# Patient Record
Sex: Female | Born: 1955
Health system: Southern US, Community
[De-identification: ages and names within clinical notes are randomized; demographics above are authoritative.]

## PROBLEM LIST (undated history)

## (undated) DIAGNOSIS — I10 Essential (primary) hypertension: Secondary | ICD-10-CM

## (undated) HISTORY — DX: Essential (primary) hypertension: I10

---

## 1998-12-23 ENCOUNTER — Encounter: Payer: Self-pay | Admitting: Emergency Medicine

## 1998-12-23 ENCOUNTER — Emergency Department (HOSPITAL_COMMUNITY): Admission: EM | Admit: 1998-12-23 | Discharge: 1998-12-23 | Payer: Self-pay | Admitting: Emergency Medicine

## 2001-01-18 ENCOUNTER — Encounter: Admission: RE | Admit: 2001-01-18 | Discharge: 2001-01-18 | Payer: Self-pay | Admitting: Cardiology

## 2001-01-18 ENCOUNTER — Encounter: Payer: Self-pay | Admitting: Cardiology

## 2002-05-03 ENCOUNTER — Other Ambulatory Visit: Admission: RE | Admit: 2002-05-03 | Discharge: 2002-05-03 | Payer: Self-pay | Admitting: Obstetrics & Gynecology

## 2002-05-07 ENCOUNTER — Encounter: Payer: Self-pay | Admitting: Obstetrics & Gynecology

## 2002-05-07 ENCOUNTER — Encounter: Admission: RE | Admit: 2002-05-07 | Discharge: 2002-05-07 | Payer: Self-pay | Admitting: Obstetrics & Gynecology

## 2002-07-19 ENCOUNTER — Encounter (INDEPENDENT_AMBULATORY_CARE_PROVIDER_SITE_OTHER): Payer: Self-pay | Admitting: Specialist

## 2002-07-19 ENCOUNTER — Ambulatory Visit (HOSPITAL_COMMUNITY): Admission: RE | Admit: 2002-07-19 | Discharge: 2002-07-19 | Payer: Self-pay | Admitting: Obstetrics & Gynecology

## 2003-06-27 ENCOUNTER — Encounter: Admission: RE | Admit: 2003-06-27 | Discharge: 2003-06-27 | Payer: Self-pay | Admitting: Obstetrics & Gynecology

## 2003-06-27 ENCOUNTER — Encounter: Payer: Self-pay | Admitting: Obstetrics & Gynecology

## 2006-01-09 ENCOUNTER — Encounter: Admission: RE | Admit: 2006-01-09 | Discharge: 2006-01-09 | Payer: Self-pay | Admitting: *Deleted

## 2006-01-26 ENCOUNTER — Encounter: Admission: RE | Admit: 2006-01-26 | Discharge: 2006-01-26 | Payer: Self-pay | Admitting: *Deleted

## 2007-05-23 ENCOUNTER — Ambulatory Visit (HOSPITAL_COMMUNITY): Admission: RE | Admit: 2007-05-23 | Discharge: 2007-05-23 | Payer: Self-pay | Admitting: Cardiology

## 2010-11-07 ENCOUNTER — Encounter: Payer: Self-pay | Admitting: *Deleted

## 2011-03-04 NOTE — Op Note (Signed)
Meredith Randolph, Meredith Randolph                       ACCOUNT NO.:  [id number]   MEDICAL RECORD NO.:  [id number]                   PATIENT TYPE:  AMB   LOCATION:  SDC                                  FACILITY:  [facility]   PHYSICIAN:  Genia Del, M.D.             DATE OF BIRTH:  [birth year]   DATE OF PROCEDURE:  07/19/2002  DATE OF DISCHARGE:                                 OPERATIVE REPORT   PREOPERATIVE DIAGNOSES:  1. Intrauterine lesion, possible myoma versus polyp.  2. Menorrhagia.  3. Known uterine myomas.   POSTOPERATIVE DIAGNOSES:  1. Intrauterine lesion, possible myoma versus polyp.  2. Menorrhagia.  3. Known uterine myomas.  4. Probable submucosal myoma about 3 cm in diameter.   SURGEON:  Genia Del, M.D.   ANESTHESIOLOGIST:  Burnett Corrente, M.D.   INTERVENTIONS:  1. Hysteroscopy with dilatation and curettage.  2. Resection of submucosal myoma.   PROCEDURE:  Under general anesthesia with endotracheal intubation, the  patient is in lithotomy position.  She is prepped with Hibiclens on the  suprapubic, vulvar, and vaginal areas.  The bladder is catheterized and the  patient is draped as usual.  The vaginal exam reveals an anteverted uterus  corresponding to about 9-10 weeks', irregular, mobile.  No adnexal mass.  The speculum is introduced.  The anterior lip of the cervix is grasped with  a tenaculum.  Dilatation of the cervix is done with Hegar dilators up to 35  without difficulty.  We then introduced the operative hysteroscope with the  double loop.  Inspection of the intrauterine cavity reveals a voluminous  submucosal myoma coming from the anterior wall of the uterus measuring about  2 cm in diameter.  Small polyps are also probably present and the cavity is  irregular.  The ostia are nonetheless visualized and pictures are taken.  We  proceed with the double loop to resection of the submucosal myoma.  This is  done with many resections followed by removal of  each piece.  They are all  sent to pathology.  We then proceed with the curettage of the intrauterine  cavity.  This specimen is also sent to pathology.  On three occasions a  curettage was done and a grasper was used to remove pieces of the myoma from  the intrauterine cavity.  We then use coagulation to complete the  hemostasis.  The intrauterine cavity is visualized and appears more regular  at the edge of the intervention.  The anterior submucosal myoma is resected  to probably about 80%.  It was considered safer to stop the intervention at  this point because visualization is  suboptimal.  On curettage, the cavity feels regular.  Five pictures are  taken at the end of the intervention as well.  Estimated blood loss was 75  cc.  Fluid __________ was 200 cc.  No complication occurred and the patient  was transferred in good status to  recovery room.                                                 Genia Del, M.D.    ML/MEDQ  D:  07/19/2002  T:  07/19/2002  Job:  [id number]

## 2012-11-19 ENCOUNTER — Ambulatory Visit (INDEPENDENT_AMBULATORY_CARE_PROVIDER_SITE_OTHER): Payer: 59 | Admitting: Emergency Medicine

## 2012-11-19 VITALS — BP 130/80 | HR 84 | Temp 99.1°F | Resp 16 | Ht 66.5 in | Wt 211.0 lb

## 2012-11-19 DIAGNOSIS — J209 Acute bronchitis, unspecified: Secondary | ICD-10-CM

## 2012-11-19 MED ORDER — AZITHROMYCIN 250 MG PO TABS: ORAL_TABLET | ORAL | Status: DC

## 2012-11-19 MED ORDER — ALBUTEROL SULFATE (2.5 MG/3ML) 0.083% IN NEBU
5.0000 mg | INHALATION_SOLUTION | Freq: Once | RESPIRATORY_TRACT | Status: AC
  Administered 2012-11-19: 5 mg via RESPIRATORY_TRACT

## 2012-11-19 MED ORDER — HYDROCOD POLST-CHLORPHEN POLST 10-8 MG/5ML PO LQCR: 5.0000 mL | Freq: Two times a day (BID) | ORAL | Status: DC | PRN

## 2012-11-19 MED ORDER — ALBUTEROL SULFATE HFA 108 (90 BASE) MCG/ACT IN AERS: 2.0000 | INHALATION_SPRAY | RESPIRATORY_TRACT | Status: DC | PRN

## 2012-11-19 MED ORDER — IPRATROPIUM BROMIDE 0.02 % IN SOLN
0.5000 mg | Freq: Once | RESPIRATORY_TRACT | Status: AC
  Administered 2012-11-19: 0.5 mg via RESPIRATORY_TRACT

## 2012-11-19 NOTE — Progress Notes (Signed)
Reviewed and agree.

## 2012-11-19 NOTE — Patient Instructions (Addendum)
Metered Dose Inhaler (No Spacer Used) Inhaled medicines are the basis of asthma treatment and other breathing problems. Inhaled medicine can only be effective if used properly. Good technique assures that the medicine reaches the lungs. Metered dose inhalers (MDIs) are used to deliver a variety of inhaled medicines. These include quick relief medicines, controller medicines (such as corticosteroids), and cromolyn. The medicine is delivered by pushing down on a metal canister to release a set amount of spray.  If you are using different kinds of inhalers, use your quick relief medicine to open the airways 10 to 15 minutes before using a steroid. If you are unsure which inhalers to use and the order of using them, ask your caregiver, nurse, or respiratory therapist. HOW TO USE THE INHALER 1. Remove cap from inhaler. 2. Shake inhaler for 5 seconds before each inhalation (breathing in). 3. Position the inhaler so that the top of the canister faces up. 4. Put your index finger on the top of the medication canister. Your thumb supports the bottom of the inhaler. 5. Open your mouth. 6. Hold the inhaler 1 to 2 inches away from your open mouth. This allows the medicine to slow down before the medicine enters the mouth. 7. Exhale (breathe out) normally and as completely as possible. 8. Press the canister down with the index finger to release the medication. 9. At the same time as the canister is pressed, inhale deeply and slowly until the lungs are completely filled. This should take 4 to 6 seconds. Keep your tongue down. 10. Hold the medication in your lungs for up to 10 seconds (10 seconds is best). This helps the medicine get into the small airways of your lungs to work better. 11. Breathe out slowly, through pursed lips. Whistling is an example of pursed lips. 12. Wait at least 1 minute between puffs. Continue with the above steps until you have taken the number of puffs your caregiver has  ordered. 13. Replace cap on inhaler. AVOID:  Inhaling before or after starting the spray of medicine. It takes practice to coordinate your breathing with triggering the spray.  Inhaling through the nose (rather than the mouth) when triggering the spray. HOW TO DETERMINE IF YOUR INHALER IS FULL OR NEARLY EMPTY:  Determine when an inhaler is empty. You cannot know when an MDI canister is empty by shaking it. A few MDIs are now being made with dose counters. Ask your caregiver for a prescription that has a dose counter if you feel you need that extra help.  If your inhaler does not have a counter, check the number of doses in the inhaler before you use it. The canister or box will list the number of doses in the canister. Divide the total number of doses in the canister by the number you will use each day to find how many days the canister will last. (For example, if your canister has 200 doses and you take 2 puffs, 4 times each day, which is 8 puffs a day. Dividing 200 by 8 equals 25. The canister should last 25 days.) Using a calendar, count forward that many days to see when your inhaler will run out. Write the refill date on a calendar or your canister.  Remember, if you need to take extra doses, the inhaler will empty sooner than you figured. Be sure you have a refill before your canister runs out. Refill your inhaler 7 to 10 days before it runs out. HOME CARE INSTRUCTIONS   Do   not use the inhaler more than your caregiver tells you. If you are still wheezing and are feeling tightness in your chest, call your caregiver.  Keep an adequate supply of medication. This includes making sure the medicine is not expired, and you have a spare MDI.  Follow your caregiver or inhaler insert directions for cleaning the inhaler. SEEK MEDICAL CARE IF:   Symptoms are only partially relieved with your inhalers.  You are having trouble using your inhalers.  You experience some increase in phlegm.  You  develop a fever of 102 F (38.9 C). SEEK IMMEDIATE MEDICAL CARE IF:   You feel little or no relief with your inhalers. You are still wheezing and are feeling shortness of breath and/or tightness in your chest.  You have side effects such as dizziness, headaches, or fast heart rate.  You have chills, fever, night sweats or an oral temperature above 102 F (38.9 C) develops.  Phlegm production increases a lot, or there is blood in the phlegm. MAKE SURE YOU:   Understand these instructions.  Will watch your condition.  Will get help right away if you are not doing well or get worse. Document Released: 07/31/2007 Document Revised: 12/26/2011 Document Reviewed: 07/21/2009 [facility] Patient Information 2013 [place], [place]. Bronchitis Bronchitis is the body's way of reacting to injury and/or infection (inflammation) of the bronchi. Bronchi are the air tubes that extend from the windpipe into the lungs. If the inflammation becomes severe, it may cause shortness of breath. CAUSES  Inflammation may be caused by:  A virus.  Germs (bacteria).  Dust.  Allergens.  Pollutants and many other irritants. The cells lining the bronchial tree are covered with tiny hairs (cilia). These constantly beat upward, away from the lungs, toward the mouth. This keeps the lungs free of pollutants. When these cells become too irritated and are unable to do their job, mucus begins to develop. This causes the characteristic cough of bronchitis. The cough clears the lungs when the cilia are unable to do their job. Without either of these protective mechanisms, the mucus would settle in the lungs. Then you would develop pneumonia. Smoking is a common cause of bronchitis and can contribute to pneumonia. Stopping this habit is the single most important thing you can do to help yourself. TREATMENT   Your caregiver may prescribe an antibiotic if the cough is caused by bacteria. Also, medicines that open up your  airways make it easier to breathe. Your caregiver may also recommend or prescribe an expectorant. It will loosen the mucus to be coughed up. Only take over-the-counter or prescription medicines for pain, discomfort, or fever as directed by your caregiver.  Removing whatever causes the problem (smoking, for example) is critical to preventing the problem from getting worse.  Cough suppressants may be prescribed for relief of cough symptoms.  Inhaled medicines may be prescribed to help with symptoms now and to help prevent problems from returning.  For those with recurrent (chronic) bronchitis, there may be a need for steroid medicines. SEEK IMMEDIATE MEDICAL CARE IF:   During treatment, you develop more pus-like mucus (purulent sputum).  You have a fever.  Your baby is older than 3 months with a rectal temperature of 102 F (38.9 C) or higher.  Your baby is 48 months old or younger with a rectal temperature of 100.4 F (38 C) or higher.  You become progressively more ill.  You have increased difficulty breathing, wheezing, or shortness of breath. It is necessary to seek immediate  medical care if you are elderly or sick from any other disease. MAKE SURE YOU:   Understand these instructions.  Will watch your condition.  Will get help right away if you are not doing well or get worse. Document Released: 10/03/2005 Document Revised: 12/26/2011 Document Reviewed: 08/12/2008 [facility] Patient Information 2013 [place], [place].

## 2012-11-19 NOTE — Progress Notes (Signed)
[facility] [address] [place] [address] [phone]  Date:  11/19/2012   Name:  Meredith Randolph   DOB:  May 23, 1956   MRN:  [id number]  PCP:  No primary provider on file.    Chief Complaint: Cough   History of Present Illness:  Meredith Randolph is a 57 y.o. very pleasant female patient who presents with the following:  Ill for a week with a cough.  Had an upper respiratory infection that she treated with OTC medications.  Now has nasal congestion, post nasal drainage and a mucoid nasal discharge complicated by a non productive rather persistent cough.  No wheezing or shortness of breath.  No nausea or vomiting. Has a few days of fever a couple days ago that has now resolved.  No history of asthma or bronchospasm.  No inhaler use.  There is no problem list on file for this patient.   No past medical history on file.  No past surgical history on file.  History  Substance Use Topics  . Smoking status: Never Smoker   . Smokeless tobacco: Not on file  . Alcohol Use: Not on file    No family history on file.  No Known Allergies  Medication list has been reviewed and updated.  No current outpatient prescriptions on file prior to visit.    Review of Systems:  As per HPI, otherwise negative.    Physical Examination: Filed Vitals:   11/19/12 0959  BP: 130/80  Pulse: 84  Temp: 99.1 F (37.3 C)  Resp: 16   Filed Vitals:   11/19/12 0959  Height: 5' 6.5" (1.689 m)  Weight: 211 lb (95.709 kg)   Body mass index is 33.55 kg/(m^2). Ideal Body Weight: Weight in (lb) to have BMI = 25: 156.9   GEN: WDWN, NAD, Non-toxic, A & O x 3 HEENT: Atraumatic, Normocephalic. Neck supple. No masses, No LAD. Ears and Nose: No external deformity. CV: RRR, No M/G/R. No JVD. No thrill. No extra heart sounds. PULM: CTA B, diffuse wheezes, no crackles, rhonchi. No retractions. No resp. distress. No accessory muscle use. ABD: S, NT, ND, +BS. No rebound. No  HSM. EXTR: No c/c/e NEURO Normal gait.  PSYCH: Normally interactive. Conversant. Not depressed or anxious appearing.  Calm demeanor.    Assessment and Plan: Bronchitis with bronchospasm Albuterol neb zpak Albuterol MDI tussionex  Carmelina Dane, MD

## 2012-11-22 ENCOUNTER — Telehealth: Payer: Self-pay

## 2012-11-22 ENCOUNTER — Ambulatory Visit (INDEPENDENT_AMBULATORY_CARE_PROVIDER_SITE_OTHER): Payer: 59 | Admitting: Family Medicine

## 2012-11-22 VITALS — BP 170/94 | HR 73 | Temp 97.8°F | Resp 18 | Ht 66.5 in | Wt 211.0 lb

## 2012-11-22 DIAGNOSIS — R42 Dizziness and giddiness: Secondary | ICD-10-CM

## 2012-11-22 DIAGNOSIS — R5383 Other fatigue: Secondary | ICD-10-CM

## 2012-11-22 DIAGNOSIS — I10 Essential (primary) hypertension: Secondary | ICD-10-CM

## 2012-11-22 DIAGNOSIS — R5381 Other malaise: Secondary | ICD-10-CM

## 2012-11-22 DIAGNOSIS — J209 Acute bronchitis, unspecified: Secondary | ICD-10-CM

## 2012-11-22 LAB — POCT CBC
Granulocyte percent: 57.9 %G (ref 37–80)
HCT, POC: 44.4 % (ref 37.7–47.9)
Hemoglobin: 14.1 g/dL (ref 12.2–16.2)
MCV: 91.8 fL (ref 80–97)
POC Granulocyte: 3.8 (ref 2–6.9)
POC LYMPH PERCENT: 34.3 %L (ref 10–50)
RBC: 4.84 M/uL (ref 4.04–5.48)

## 2012-11-22 LAB — COMPREHENSIVE METABOLIC PANEL
ALT: 31 U/L (ref 0–35)
Albumin: 4.3 g/dL (ref 3.5–5.2)
CO2: 27 mEq/L (ref 19–32)
Chloride: 102 mEq/L (ref 96–112)
Glucose, Bld: 90 mg/dL (ref 70–99)
Potassium: 4.4 mEq/L (ref 3.5–5.3)
Sodium: 139 mEq/L (ref 135–145)
Total Bilirubin: 0.4 mg/dL (ref 0.3–1.2)
Total Protein: 7.5 g/dL (ref 6.0–8.3)

## 2012-11-22 LAB — POCT URINALYSIS DIPSTICK
Ketones, UA: 15
Leukocytes, UA: NEGATIVE
Protein, UA: 30
Urobilinogen, UA: 0.2
pH, UA: 5

## 2012-11-22 LAB — GLUCOSE, POCT (MANUAL RESULT ENTRY): POC Glucose: 86 mg/dl (ref 70–99)

## 2012-11-22 NOTE — Telephone Encounter (Signed)
Assessment and Plan:  Bronchitis with bronchospasm  Albuterol neb  zpak  Albuterol MDI  tussionex From office visit on 11/20/11. Patient c/o feeling weak and dizzy, she states her chest congestion is improving but feels very weak. I have advised her to return to clinic. She will come in now.

## 2012-11-22 NOTE — Progress Notes (Signed)
[address]   [place], [place]  [address]   [phone]  Subjective:    Patient ID: Meredith Randolph, female    DOB: 1956-06-23, 57 y.o.   MRN: [id number]  HPIThis 57 y.o. female presents for evaluation of fatigue, dizziness.  Evaluated on 11/19/12 for cough; diagnosed with bronchitis; rx for Zpack, Albuterol, Tussionex. Taking medication as prescribed; taking Tussionex bid.   Chest congestion improving but now feeling weak, nauseated, mild dizziness.  No fever but +sweats; mild chills.  No headache.  No ear pain or sore throat.  No rhinorrhea; +nasal congestion; +PND.  +coughing; no SOB; +sputum production.  No ringing in ears.  Mild nausea this morning with dizziness.  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  No food intake at that point in the morning; has been eating and drinking normally.  Duration of dizziness for a few minutes this morning.  Felt like walking on boat; no spinning of the room.  No blurred vision or double vision.  Weakness has continued today.  Felt better yesterday and then awoke and felt poorly.  No focal weakness; only overall malaise and weakness.  No v/d.  Normal urine output.  History of HTN; stopped medication because improved with diet and exercise.  PCP: [facility] last visit several years ago.    Review of Systems  Constitutional: Positive for chills, diaphoresis and fatigue. Negative for fever.  HENT: Positive for congestion, postnasal drip and sinus pressure. Negative for sore throat, rhinorrhea, trouble swallowing and voice change.   Respiratory: Positive for cough. Negative for shortness of breath, wheezing and stridor.   Cardiovascular: Negative for chest pain, palpitations and leg swelling.  Gastrointestinal: Positive for nausea. Negative for vomiting, abdominal pain and diarrhea.  Genitourinary: Negative for dysuria, frequency, decreased urine volume and difficulty urinating.  Neurological: Positive for dizziness,  weakness and light-headedness. Negative for tremors, seizures, syncope, facial asymmetry, speech difficulty, numbness and headaches.  Hematological: Negative for adenopathy.  Psychiatric/Behavioral: Negative for confusion.    History reviewed. No pertinent past medical history.  History reviewed. No pertinent past surgical history.  Prior to Admission medications   Medication Sig Start Date End Date Taking? Authorizing Provider  albuterol (PROVENTIL HFA;VENTOLIN HFA) 108 (90 BASE) MCG/ACT inhaler Inhale 2 puffs into the lungs every 4 (four) hours as needed for wheezing (cough, shortness of breath or wheezing.). 11/19/12  Yes Phillips Odor, MD  azithromycin (ZITHROMAX) 250 MG tablet Take 2 tabs PO x 1 dose, then 1 tab PO QD x 4 days 11/19/12  Yes Phillips Odor, MD  chlorpheniramine-HYDROcodone [facility] PENNKINETIC ER) 10-8 MG/5ML LQCR Take 5 mLs by mouth every 12 (twelve) hours as needed (cough). 11/19/12  Yes Phillips Odor, MD    No Known Allergies  History   Social History  . Marital Status: Married    Spouse Name: N/A    Number of Children: N/A  . Years of Education: N/A   Occupational History  . Not on file.   Social History Main Topics  . Smoking status: Former Games developer  . Smokeless tobacco: Not on file  . Alcohol Use: No  . Drug Use: No  . Sexually Active: Not on file   Other Topics Concern  . Not on file   Social History Narrative   Employment: retired.    No family history on file.     Objective:   Physical Exam  Nursing note and vitals reviewed. Constitutional: She is oriented to  person, place, and time. She appears well-developed and well-nourished. No distress.  HENT:  Head: Normocephalic and atraumatic.  Right Ear: External ear normal.  Left Ear: External ear normal.  Nose: Nose normal.  Mouth/Throat: Oropharynx is clear and moist.  Eyes: Conjunctivae normal and EOM are normal. Pupils are equal, round, and reactive to light.  Neck: Normal range  of motion. Neck supple. No JVD present. No thyromegaly present.  Cardiovascular: Normal rate, regular rhythm and normal heart sounds.  Exam reveals no gallop and no friction rub.   No murmur heard. Pulmonary/Chest: Effort normal and breath sounds normal. She has no wheezes. She has no rales.  Abdominal: Soft. Bowel sounds are normal. She exhibits no distension and no mass. There is no tenderness. There is no rebound and no guarding.  Lymphadenopathy:    She has no cervical adenopathy.  Neurological: She is alert and oriented to person, place, and time. She has normal strength. No cranial nerve deficit. She displays a negative Romberg sign. Coordination normal.  Skin: She is not diaphoretic.  Psychiatric: She has a normal mood and affect. Her behavior is normal.    EKG:  NSR; no arrhythmias; no ST changes.  Results for orders placed in visit on 11/22/12  POCT CBC      Component Value Range   WBC 6.6  4.6 - 10.2 K/uL   Lymph, poc 2.3  0.6 - 3.4   POC LYMPH PERCENT 34.3  10 - 50 %L   MID (cbc) 0.5  0 - 0.9   POC MID % 7.8  0 - 12 %M   POC Granulocyte 3.8  2 - 6.9   Granulocyte percent 57.9  37 - 80 %G   RBC 4.84  4.04 - 5.48 M/uL   Hemoglobin 14.1  12.2 - 16.2 g/dL   HCT, POC 40.9  81.1 - 47.9 %   MCV 91.8  80 - 97 fL   MCH, POC 29.1  27 - 31.2 pg   MCHC 31.8  31.8 - 35.4 g/dL   RDW, POC 91.4     Platelet Count, POC 199  142 - 424 K/uL   MPV 11.9  0 - 99.8 fL  GLUCOSE, POCT (MANUAL RESULT ENTRY)      Component Value Range   POC Glucose 86  70 - 99 mg/dl  POCT URINALYSIS DIPSTICK      Component Value Range   Color, UA amber     Clarity, UA cloudy     Glucose, UA neg     Bilirubin, UA neg     Ketones, UA 15     Spec Grav, UA >=1.030     Blood, UA neg     pH, UA 5.0     Protein, UA 30     Urobilinogen, UA 0.2     Nitrite, UA neg     Leukocytes, UA Negative           Assessment & Plan:   1. HTN (hypertension)  POCT CBC, POCT glucose (manual entry), Comprehensive  metabolic panel, POCT urinalysis dipstick, EKG 12-Lead  2. Dizziness  POCT CBC, POCT glucose (manual entry), Comprehensive metabolic panel, POCT urinalysis dipstick  3. Fatigue  POCT CBC, POCT glucose (manual entry), Comprehensive metabolic panel, POCT urinalysis dipstick  4. Acute bronchitis       1.  Dizziness:  New.  Normal neurological exam.  Normal glucose, CBC.  Normal EKG.  Urine concentrated; dehydration and acute illness likely contributing; medication/Tussionex may also be contributing.  Advised to stop Tussionex and increase fluid intake. 2.  Fatigue/Malaise:  New.  Associated with dizziness: supportive care with rest, fluids.  May also be secondary to acute illness, medication. Advise to stop Tussionex. 3.  HTN:  Worsening; normal BP at visit 48 hours ago with elevation today.  Recommend checking blood pressure daily at home.  If BP remains>140/90, RTC for treatment.   4.  Acute Bronchitis: improving; stop Tussionex due to current symptoms; complete Zpack.

## 2012-11-22 NOTE — Patient Instructions (Addendum)
1. HTN (hypertension)  POCT CBC, POCT glucose (manual entry), Comprehensive metabolic panel, POCT urinalysis dipstick, EKG 12-Lead  2. Dizziness  POCT CBC, POCT glucose (manual entry), Comprehensive metabolic panel, POCT urinalysis dipstick  3. Fatigue  POCT CBC, POCT glucose (manual entry), Comprehensive metabolic panel, POCT urinalysis dipstick  4. Acute bronchitis       INCREASE FLUID INTAKE OVER NEXT WEEK. STOP TAKING COUGH SYRUP.

## 2012-11-22 NOTE — Telephone Encounter (Signed)
Patient was seen by Dr. Dareen Piano, she is weak, and dizzy would like a call back.   She asked when she could expect a call back, advised within 24 hours.  "What if the patient is dying?"  Advised to call [phone]  [phone]

## 2013-01-02 ENCOUNTER — Other Ambulatory Visit: Payer: Self-pay | Admitting: Emergency Medicine

## 2013-05-28 ENCOUNTER — Ambulatory Visit (INDEPENDENT_AMBULATORY_CARE_PROVIDER_SITE_OTHER): Payer: 59 | Admitting: Family Medicine

## 2013-05-28 VITALS — BP 138/80 | HR 65 | Temp 97.9°F | Resp 18 | Ht 65.0 in | Wt 210.0 lb

## 2013-05-28 DIAGNOSIS — G44209 Tension-type headache, unspecified, not intractable: Secondary | ICD-10-CM

## 2013-05-28 DIAGNOSIS — I10 Essential (primary) hypertension: Secondary | ICD-10-CM

## 2013-05-28 MED ORDER — NAPROXEN 500 MG PO TABS: 500.0000 mg | ORAL_TABLET | Freq: Two times a day (BID) | ORAL | Status: DC

## 2013-05-28 MED ORDER — LOSARTAN POTASSIUM 100 MG PO TABS: 100.0000 mg | ORAL_TABLET | Freq: Every day | ORAL | Status: DC

## 2013-05-28 NOTE — Progress Notes (Signed)
  Subjective:    Patient ID: Meredith Randolph, female    DOB: [birth year], 57 y.o.   MRN: [id number] Chief Complaint  Patient presents with  . Hypertension    elevated bp , headaches yesterday    HPI Used to be on BP meds but ran out about a yr ago and was doing well off of medicine with TLC and weight loss.  Checks BP at home and was often 150s systolic.  However, last night she knew her BP was up - she didn't check it - but she had severe HA and left neck pain and a sweat. Took a low dose of Exforge - her husband's BP medicine. All sxs resolved about after she took the exforge.  However, she still has a posterior HA today. She has not taken any medication - nsaids or tylenol for this.  History reviewed. No pertinent past medical history. No current outpatient prescriptions on file prior to visit.   No current facility-administered medications on file prior to visit.   No Known Allergies   Review of Systems  Constitutional: Positive for fatigue. Negative for fever, chills, diaphoresis and appetite change.  Eyes: Negative for visual disturbance.  Respiratory: Negative for cough and shortness of breath.   Cardiovascular: Negative for chest pain, palpitations and leg swelling.  Genitourinary: Negative for decreased urine volume.  Neurological: Positive for headaches. Negative for syncope.  Hematological: Does not bruise/bleed easily.      BP 138/80  Pulse 65  Temp(Src) 97.9 F (36.6 C) (Oral)  Resp 18  Ht 5\' 5"  (1.651 m)  Wt 210 lb (95.255 kg)  BMI 34.95 kg/m2  SpO2 96% Objective:   Physical Exam  Constitutional: She is oriented to person, place, and time. She appears well-developed and well-nourished. No distress.  HENT:  Head: Normocephalic and atraumatic.  Right Ear: External ear normal.  Left Ear: External ear normal.  Eyes: Conjunctivae are normal. No scleral icterus.  Neck: Normal range of motion. Neck supple. No thyromegaly present.  Cardiovascular: Normal  rate, regular rhythm, normal heart sounds and intact distal pulses.   Pulmonary/Chest: Effort normal and breath sounds normal. No respiratory distress.  Musculoskeletal: She exhibits no edema.  Lymphadenopathy:    She has no cervical adenopathy.  Neurological: She is alert and oriented to person, place, and time.  Skin: Skin is warm and dry. She is not diaphoretic. No erythema.  Psychiatric: She has a normal mood and affect. Her behavior is normal.          Assessment & Plan:  Tension headache - RTC if HA cont or worsens.  Essential hypertension, benign- recheck in 2-3 weeks here with labs.  Meds ordered this encounter  Medications  . losartan (COZAAR) 100 MG tablet    Sig: Take 1 tablet (100 mg total) by mouth daily.    Dispense:  90 tablet    Refill:  0  . naproxen (NAPROSYN) 500 MG tablet    Sig: Take 1 tablet (500 mg total) by mouth 2 (two) times daily with a meal. For a headache    Dispense:  30 tablet    Refill:  0

## 2013-05-30 DIAGNOSIS — I10 Essential (primary) hypertension: Secondary | ICD-10-CM | POA: Insufficient documentation

## 2014-02-11 ENCOUNTER — Other Ambulatory Visit: Payer: Self-pay | Admitting: Family Medicine

## 2014-03-15 ENCOUNTER — Other Ambulatory Visit: Payer: Self-pay | Admitting: Physician Assistant

## 2014-03-16 NOTE — Telephone Encounter (Signed)
Needs office visit.

## 2014-03-30 ENCOUNTER — Other Ambulatory Visit: Payer: Self-pay | Admitting: Physician Assistant

## 2014-03-30 DIAGNOSIS — Z13220 Encounter for screening for lipoid disorders: Secondary | ICD-10-CM

## 2014-03-30 DIAGNOSIS — R748 Abnormal levels of other serum enzymes: Secondary | ICD-10-CM

## 2014-03-31 ENCOUNTER — Ambulatory Visit (INDEPENDENT_AMBULATORY_CARE_PROVIDER_SITE_OTHER): Payer: 59 | Admitting: Family Medicine

## 2014-03-31 VITALS — BP 140/90 | HR 61 | Temp 97.7°F | Resp 18 | Ht 64.5 in | Wt 212.6 lb

## 2014-03-31 DIAGNOSIS — I10 Essential (primary) hypertension: Secondary | ICD-10-CM

## 2014-03-31 LAB — COMPLETE METABOLIC PANEL WITHOUT GFR
ALT: 71 U/L — ABNORMAL HIGH (ref 0–35)
AST: 52 U/L — ABNORMAL HIGH (ref 0–37)
Albumin: 4.1 g/dL (ref 3.5–5.2)
BUN: 15 mg/dL (ref 6–23)
Calcium: 9.2 mg/dL (ref 8.4–10.5)
Chloride: 105 meq/L (ref 96–112)
Potassium: 4.2 meq/L (ref 3.5–5.3)
Total Protein: 7.2 g/dL (ref 6.0–8.3)

## 2014-03-31 LAB — COMPLETE METABOLIC PANEL WITH GFR
Alkaline Phosphatase: 191 U/L — ABNORMAL HIGH (ref 39–117)
CO2: 27 mEq/L (ref 19–32)
Creat: 0.87 mg/dL (ref 0.50–1.10)
GFR, Est African American: 85 mL/min
GFR, Est Non African American: 74 mL/min
Glucose, Bld: 89 mg/dL (ref 70–99)
Sodium: 141 mEq/L (ref 135–145)
Total Bilirubin: 0.6 mg/dL (ref 0.2–1.2)

## 2014-03-31 LAB — MICROALBUMIN, URINE: Microalb, Ur: 1.85 mg/dL (ref 0.00–1.89)

## 2014-03-31 MED ORDER — LOSARTAN POTASSIUM 100 MG PO TABS
100.0000 mg | ORAL_TABLET | Freq: Every day | ORAL | Status: DC
Start: 2014-03-31 — End: 2016-07-26

## 2014-04-01 ENCOUNTER — Encounter: Payer: Self-pay | Admitting: Family Medicine

## 2014-04-01 NOTE — Progress Notes (Signed)
Chief Complaint:  Chief Complaint  Patient presents with  . Medication Refill    losartan    HPI: Meredith Randolph is a 58 y.o. female who is here for  HTN medication refills. She does not take her meds all the time She does not take her BP all the time She denies any CP, dizziness, HAs , palpitations currently She has tried HCTZ in the past and that did not go well. She is not sure if she has had any   Past Medical History  Diagnosis Date  . Hypertension    History reviewed. No pertinent past surgical history. History   Social History  . Marital Status: Married    Spouse Name: N/A    Number of Children: N/A  . Years of Education: N/A   Social History Main Topics  . Smoking status: Former Games developermoker  . Smokeless tobacco: None  . Alcohol Use: No  . Drug Use: No  . Sexual Activity: None   Other Topics Concern  . None   Social History Narrative   Employment: retired.   History reviewed. No pertinent family history. No Known Allergies Prior to Admission medications   Medication Sig Start Date End Date Taking? Authorizing Provider  losartan (COZAAR) 100 MG tablet Take 1 tablet (100 mg total) by mouth daily. 03/31/14  Yes Brannon Decaire P Rojean Ige, DO     ROS: The patient denies fevers, chills, night sweats, unintentional weight loss, chest pain, palpitations, wheezing, dyspnea on exertion, nausea, vomiting, abdominal pain, dysuria, hematuria, melena, numbness, weakness, or tingling.   All other systems have been reviewed and were otherwise negative with the exception of those mentioned in the HPI and as above.    PHYSICAL EXAM: Filed Vitals:   03/31/14 0959  BP: 140/90  Pulse: 61  Temp: 97.7 F (36.5 C)  Resp: 18   Filed Vitals:   03/31/14 0959  Height: 5' 4.5" (1.638 m)  Weight: 212 lb 9.6 oz (96.435 kg)   Body mass index is 35.94 kg/(m^2).  General: Alert, no acute distress HEENT:  Normocephalic, atraumatic, oropharynx patent. EOMI, PERRLA Cardiovascular:   Regular rate and rhythm, no rubs murmurs or gallops.  No Carotid bruits, radial pulse intact. No pedal edema.  Respiratory: Clear to auscultation bilaterally.  No wheezes, rales, or rhonchi.  No cyanosis, no use of accessory musculature GI: No organomegaly, abdomen is soft and non-tender, positive bowel sounds.  No masses. Skin: No rashes. Neurologic: Facial musculature symmetric. Psychiatric: Patient is appropriate throughout our interaction. Lymphatic: No cervical lymphadenopathy Musculoskeletal: Gait intact.   LABS: Results for orders placed in visit on 03/31/14  COMPLETE METABOLIC PANEL WITH GFR      Result Value Ref Range   Sodium 141  135 - 145 mEq/L   Potassium 4.2  3.5 - 5.3 mEq/L   Chloride 105  96 - 112 mEq/L   CO2 27  19 - 32 mEq/L   Glucose, Bld 89  70 - 99 mg/dL   BUN 15  6 - 23 mg/dL   Creat 1.910.87  4.780.50 - 2.951.10 mg/dL   Total Bilirubin 0.6  0.2 - 1.2 mg/dL   Alkaline Phosphatase 191 (*) 39 - 117 U/L   AST 52 (*) 0 - 37 U/L   ALT 71 (*) 0 - 35 U/L   Total Protein 7.2  6.0 - 8.3 g/dL   Albumin 4.1  3.5 - 5.2 g/dL   Calcium 9.2  8.4 - 62.110.5 mg/dL   GFR,  Est African American 85     GFR, Est Non African American 74    MICROALBUMIN, URINE      Result Value Ref Range   Microalb, Ur 1.85  0.00 - 1.89 mg/dL     EKG/XRAY:   Primary read [facility] Archimedes [facility].   ASSESSMENT/PLAN: Encounter Diagnosis  Name Primary?  . HTN (hypertension) Yes   Refilled meds x 6 months Advise her to modify diet and exercise, medication compliancy, BP goal should be less than 150/90 if higher than need to consider other medications Labs pending F/u 6 months  Gross sideeffects, risk and benefits, and alternatives of medications d/w patient. Patient is aware that all medications have potential sideeffects and we are unable to predict every sideeffect or drug-drug interaction that may occur.  Janelle Spellman PHUONG, DO 04/01/2014 10:08 AM

## 2014-04-09 NOTE — Telephone Encounter (Signed)
Spoke to patient about labs, will need recheck labs only. She deneis GB, liver issues in past, alcohol use, excessive tylenol use, new meds, high cholesterol, new foods or travels. Has some mild intermittent very non-noticeable LUQ abd pain but nothing to complain about if I had not asked. No n/v or problems with food intake. She will come in for lipids and also cmp, if still abnormal then will add acute hepatitis panel and also get abd US.

## 2014-04-10 ENCOUNTER — Other Ambulatory Visit (INDEPENDENT_AMBULATORY_CARE_PROVIDER_SITE_OTHER): Payer: 59 | Admitting: Family Medicine

## 2014-04-10 ENCOUNTER — Other Ambulatory Visit: Payer: Self-pay | Admitting: Family Medicine

## 2014-04-10 DIAGNOSIS — R748 Abnormal levels of other serum enzymes: Secondary | ICD-10-CM

## 2014-04-10 DIAGNOSIS — Z13220 Encounter for screening for lipoid disorders: Secondary | ICD-10-CM

## 2014-04-10 LAB — COMPLETE METABOLIC PANEL WITHOUT GFR
BUN: 21 mg/dL (ref 6–23)
CO2: 26 meq/L (ref 19–32)
Creat: 0.89 mg/dL (ref 0.50–1.10)
GFR, Est African American: 83 mL/min
GFR, Est Non African American: 72 mL/min
Glucose, Bld: 92 mg/dL (ref 70–99)
Total Bilirubin: 0.6 mg/dL (ref 0.2–1.2)

## 2014-04-10 LAB — LIPID PANEL
Cholesterol: 232 mg/dL — ABNORMAL HIGH (ref 0–200)
HDL: 79 mg/dL (ref >39)
LDL Cholesterol: 142 mg/dL — ABNORMAL HIGH (ref 0–99)
Total CHOL/HDL Ratio: 2.9 Ratio
Triglycerides: 54 mg/dL (ref <150)
VLDL: 11 mg/dL (ref 0–40)

## 2014-04-10 LAB — COMPLETE METABOLIC PANEL WITH GFR
ALT: 54 U/L — ABNORMAL HIGH (ref 0–35)
AST: 27 U/L (ref 0–37)
Albumin: 4.3 g/dL (ref 3.5–5.2)
Alkaline Phosphatase: 207 U/L — ABNORMAL HIGH (ref 39–117)
Calcium: 9.5 mg/dL (ref 8.4–10.5)
Chloride: 106 mEq/L (ref 96–112)
Potassium: 4.2 mEq/L (ref 3.5–5.3)
Sodium: 141 mEq/L (ref 135–145)
Total Protein: 7.5 g/dL (ref 6.0–8.3)

## 2014-04-21 LAB — ALKALINE PHOSPHATASE ISOENZYMES
Alkaline Phonsphatase: 217 U/L — ABNORMAL HIGH (ref 33–130)
Bone Isoenzymes: 15 % — ABNORMAL LOW (ref 28–66)
Intestinal Isoenzymes: 8 % (ref 1–24)
Liver Isoenzymes: 53 % (ref 25–69)
Macrohepatic isoenzymes: 24 % — ABNORMAL HIGH

## 2014-04-23 ENCOUNTER — Telehealth: Payer: Self-pay | Admitting: Family Medicine

## 2014-04-23 ENCOUNTER — Other Ambulatory Visit: Payer: Self-pay | Admitting: Family Medicine

## 2014-04-23 DIAGNOSIS — R748 Abnormal levels of other serum enzymes: Secondary | ICD-10-CM

## 2014-04-23 NOTE — Telephone Encounter (Signed)
Spoke to patient about labs. She will repeat CMP . She did take a detox liver supplement 1 week before getting labs. Advise to stop and to stop any tylenol. IF we need to do advance imaging then I will start with US again . She has occ RUQ abd pain but so rare, no GI sxs, no recent food poisoning issues Chart review showed a US in 2008 with :  IMPRESSION  1. No evidence of cholelithiasis or cholecystitis.  2. Extrahepatic common duct dilatation. No cause identified. A distal  obstructive stone or mass cannot be excluded. Consider further evaluation with  contrast-enhanced CT or MR-MRCP.  Provider: Janne NapoleonMeiko Brown

## 2014-05-09 ENCOUNTER — Other Ambulatory Visit: Payer: Self-pay | Admitting: Family Medicine

## 2014-05-09 ENCOUNTER — Other Ambulatory Visit (INDEPENDENT_AMBULATORY_CARE_PROVIDER_SITE_OTHER): Payer: 59 | Admitting: Family Medicine

## 2014-05-09 DIAGNOSIS — R748 Abnormal levels of other serum enzymes: Secondary | ICD-10-CM

## 2014-05-10 LAB — COMPLETE METABOLIC PANEL WITH GFR
BUN: 17 mg/dL (ref 6–23)
CO2: 20 mEq/L (ref 19–32)
Creat: 0.87 mg/dL (ref 0.50–1.10)
GFR, Est African American: 85 mL/min
GFR, Est Non African American: 74 mL/min
Glucose, Bld: 89 mg/dL (ref 70–99)
Sodium: 141 mEq/L (ref 135–145)
Total Bilirubin: 0.5 mg/dL (ref 0.2–1.2)
Total Protein: 7.2 g/dL (ref 6.0–8.3)

## 2014-05-10 LAB — COMPLETE METABOLIC PANEL WITHOUT GFR
ALT: 46 U/L — ABNORMAL HIGH (ref 0–35)
AST: 46 U/L — ABNORMAL HIGH (ref 0–37)
Albumin: 4.2 g/dL (ref 3.5–5.2)
Alkaline Phosphatase: 179 U/L — ABNORMAL HIGH (ref 39–117)
Calcium: 9.1 mg/dL (ref 8.4–10.5)
Chloride: 106 meq/L (ref 96–112)
Potassium: 4.3 meq/L (ref 3.5–5.3)

## 2014-05-13 ENCOUNTER — Telehealth: Payer: Self-pay | Admitting: Family Medicine

## 2014-05-13 LAB — HEPATITIS PANEL, ACUTE
HCV Ab: NEGATIVE
Hep A IgM: NONREACTIVE
Hep B C IgM: NONREACTIVE
Hepatitis B Surface Ag: NEGATIVE

## 2014-05-13 LAB — HEPATITIS B SURFACE ANTIBODY,QUALITATIVE: Hep B S Ab: POSITIVE — AB

## 2014-05-13 NOTE — Telephone Encounter (Signed)
Spoke to patient about consistently elevated liver enzymes-repeat test still abnormal Will get hepatitis panel and if no abnormalities then will get US abd  She is ok with getting labs first and then proceeding with US  Prior history of extrahepatic duct dilation on 2008 US RUQ  See below:  Findings: Normal gallbladder without wall thickening, stone, or pericholecystic  fluid. Sonographic Murphy sign not elicited.  No intrahepatic biliary ductal dilatation. The extra hepatic duct is dilated for  age and measures up to 11-12 mm (normal 5-7 mm in this age group).  Normal IVC and pancreas. Spleen normal in size and echotexture.  Right kidney 11.8 and left kidney 11.7 cm. No hydronephrosis.  Abdominal aorta non-aneurysmal. No ascites.  IMPRESSION  1. No evidence of cholelithiasis or cholecystitis.  2. Extrahepatic common duct dilatation. No cause identified. A distal  obstructive stone or mass cannot be excluded. Consider further evaluation with  contrast-enhanced CT or MR-MRCP.  Provider: Janne NapoleonMeiko Brown

## 2014-05-14 ENCOUNTER — Telehealth: Payer: Self-pay | Admitting: Family Medicine

## 2014-05-14 DIAGNOSIS — R748 Abnormal levels of other serum enzymes: Secondary | ICD-10-CM

## 2014-05-14 DIAGNOSIS — Q445 Other congenital malformations of bile ducts: Secondary | ICD-10-CM

## 2014-05-14 NOTE — Telephone Encounter (Signed)
Spoke to patient about labs. She does get Hep B vacciens for her work. She does not have acute hepatitis to explain elevated liver enzymes x 2. Will get abd [place]

## 2014-05-19 ENCOUNTER — Other Ambulatory Visit: Payer: Self-pay

## 2014-05-20 ENCOUNTER — Encounter (INDEPENDENT_AMBULATORY_CARE_PROVIDER_SITE_OTHER): Payer: Self-pay

## 2014-05-20 ENCOUNTER — Telehealth: Payer: Self-pay | Admitting: Family Medicine

## 2014-05-20 ENCOUNTER — Ambulatory Visit
Admission: RE | Admit: 2014-05-20 | Discharge: 2014-05-20 | Disposition: A | Discharge status: Home / Self Care | Payer: 59 | Source: Ambulatory Visit | Attending: Family Medicine | Admitting: Family Medicine

## 2014-05-20 DIAGNOSIS — R748 Abnormal levels of other serum enzymes: Secondary | ICD-10-CM

## 2014-05-20 DIAGNOSIS — Q445 Other congenital malformations of bile ducts: Secondary | ICD-10-CM

## 2014-05-20 NOTE — Telephone Encounter (Signed)
Spoke to patient about US results. Will moniotr LFTs for now since hs no sxs and also US is essentionally normal. Will get LFTs in 2 months. Advise to avoid etoh , tylenol, herbal supplements.

## 2015-02-02 ENCOUNTER — Ambulatory Visit (INDEPENDENT_AMBULATORY_CARE_PROVIDER_SITE_OTHER): Payer: 59 | Admitting: Family Medicine

## 2015-02-02 ENCOUNTER — Ambulatory Visit (INDEPENDENT_AMBULATORY_CARE_PROVIDER_SITE_OTHER): Payer: 59

## 2015-02-02 VITALS — BP 140/100 | HR 60 | Temp 97.5°F | Resp 18 | Ht 64.5 in | Wt 215.4 lb

## 2015-02-02 DIAGNOSIS — M25561 Pain in right knee: Secondary | ICD-10-CM | POA: Diagnosis not present

## 2015-02-02 DIAGNOSIS — I10 Essential (primary) hypertension: Secondary | ICD-10-CM | POA: Diagnosis not present

## 2015-02-02 DIAGNOSIS — E785 Hyperlipidemia, unspecified: Secondary | ICD-10-CM

## 2015-02-02 LAB — COMPLETE METABOLIC PANEL WITH GFR
ALBUMIN: 4.1 g/dL (ref 3.5–5.2)
ALK PHOS: 174 U/L — AB (ref 39–117)
ALT: 54 U/L — AB (ref 0–35)
AST: 45 U/L — AB (ref 0–37)
BUN: 17 mg/dL (ref 6–23)
CALCIUM: 9.2 mg/dL (ref 8.4–10.5)
CHLORIDE: 106 meq/L (ref 96–112)
CO2: 25 mEq/L (ref 19–32)
Creat: 0.83 mg/dL (ref 0.50–1.10)
GFR, Est African American: 89 mL/min
GFR, Est Non African American: 77 mL/min
Glucose, Bld: 105 mg/dL — ABNORMAL HIGH (ref 70–99)
POTASSIUM: 4.2 meq/L (ref 3.5–5.3)
SODIUM: 141 meq/L (ref 135–145)
TOTAL PROTEIN: 7.3 g/dL (ref 6.0–8.3)
Total Bilirubin: 0.6 mg/dL (ref 0.2–1.2)

## 2015-02-02 MED ORDER — DICLOFENAC SODIUM 50 MG PO TBEC: 50.0000 mg | DELAYED_RELEASE_TABLET | Freq: Two times a day (BID) | ORAL | Status: DC

## 2015-02-02 MED ORDER — LOSARTAN POTASSIUM-HCTZ 100-12.5 MG PO TABS: 1.0000 | ORAL_TABLET | Freq: Every day | ORAL | Status: DC

## 2015-02-02 NOTE — Patient Instructions (Addendum)
I will let you know the results of your labs in a few days  Take the losartan/HCTZ 100/12.5 one each morning. Taking medicine faithfully.  I recommend that you monitor your blood pressure periodically either by getting yourself a cough or by going to a pharmacy or somewhere to get it checked every week or 2. The goal is to have a blood pressure consistently less than 140/90, but ideal is down around 120/70.  Advise regular exercise and weight loss as being the 2 things that you can do that we'll most likely help your blood pressure on your own.  Plan to return in about 3 months to see how you're doing on the new medication, sooner if needed  If you knee is not improving over the next couple of weeks call and I will refer you to an orthopedic doctor

## 2015-02-02 NOTE — Progress Notes (Signed)
Subjective: Patient is here for a refill of her blood pressure medicine. She takes it faithfully. She does have problems with pain in the right knee for last month or 5 weeks. She bumped into up [employer] and it is hurting ever since then on the front lateral part of the knee but also now has move some around the medial part. Is not swollen. No prior knee problems. She denies any chest pain or shortness of breath or stomach problems. Other major complaints.  Objective: Pleasant lady in no major distress. Throat clear. TMs normal. Neck supple without nodes. Chest clear to auscultation. Heart regular without murmurs gallops or arrhythmias. Soft without masses tenderness. She has no swelling in the knee. No crepitance. The joint seems to have good ligamentous strength in all parameters. It is tender just below and lateral to the patella.  Assessment: Right knee pain from injury Hypertension unsatisfactory control  Plan  add hydrochlorothiazide 12.5 to her blood pressure medicine Check labs and x-ray knee. I did get a C met because a couple of liver enzymes were little high last time. Her cholesterol was high in the past that she says that she does not want cholesterol medicine. We will see what is running this time.  [facility] reading (PRIMARY) by  Dr. Linna Darner Normal knee  Diclofenac 50 mg twice a day for knee. If not improving she can call back and we will make a referral to orthopedics.    Patient states that she is not going to take another medicine even if I told her to when the labs come back. She is very reluctant to take the losartan HCT, but she is willing for me to initially prescribed it and she will let me know if she has side effects.

## 2015-02-04 ENCOUNTER — Encounter: Payer: Self-pay | Admitting: Family Medicine

## 2015-02-15 ENCOUNTER — Other Ambulatory Visit: Payer: Self-pay | Admitting: Family Medicine

## 2015-02-17 ENCOUNTER — Telehealth: Payer: Self-pay | Admitting: *Deleted

## 2015-02-17 NOTE — Telephone Encounter (Signed)
Spoke with patient about knee pain, states she is feeling much better, has a week left of Voltaren, so is going to hold off on refilling prescription to see how she is doing.

## 2015-03-05 ENCOUNTER — Ambulatory Visit (INDEPENDENT_AMBULATORY_CARE_PROVIDER_SITE_OTHER): Payer: 59 | Admitting: Family Medicine

## 2015-03-05 VITALS — BP 160/102 | HR 57 | Temp 97.8°F | Resp 18 | Ht 64.5 in | Wt 215.0 lb

## 2015-03-05 DIAGNOSIS — I10 Essential (primary) hypertension: Secondary | ICD-10-CM

## 2015-03-05 DIAGNOSIS — M25561 Pain in right knee: Secondary | ICD-10-CM

## 2015-03-05 DIAGNOSIS — G8929 Other chronic pain: Secondary | ICD-10-CM | POA: Diagnosis not present

## 2015-03-05 NOTE — Patient Instructions (Addendum)
Take acetaminophen (Tylenol ) 2 pills twice or occasionally 3 times daily if needed for pain  Only use naproxen (Aleve) or ibuprofen on rare occasion for worse pain, not for daily use  Apply ice for 15 or 20 minutes if having worse pain  Get up and stretch regularly  Blood pressure medication as discussed  Get blood pressure followed up on in about 3 months  Return at anytime if needed  If you desire me to send you to an orthopedist at any time please contact me  Daily walk

## 2015-03-05 NOTE — Progress Notes (Signed)
Subjective: Patient continues to have pain in the right knee anterior/lateral area where she bumped it originally. She sits with the knee bent for a long time it is stiff and hard to move and painful. No swelling or effusion.  She has not been taking the blood pressure medications as per the previous chart, but she says now that she is willing to take it.  Objective: Chest and lungs not examined today Right knee has no crepitance or effusion. It is mildly tender just lateral to the lower end of the patella.  Assessment: Knee pain Hypertension  Plan: Stressed that they come and a dominate or which would help her blood pressure and her knee would be regular exercise and weight loss. Offered injection or referral for her knee, but she chose just symptomatic treatment. See instructions. If she does decide to take the blood pressure medications have ordered she is asked come back in about 3 months. If she decides she wants to see orthopedist I'll be happy to refer her.

## 2015-03-19 ENCOUNTER — Other Ambulatory Visit: Payer: Self-pay | Admitting: Family Medicine

## 2016-01-07 ENCOUNTER — Emergency Department (HOSPITAL_COMMUNITY): Payer: 59

## 2016-01-07 ENCOUNTER — Encounter (HOSPITAL_COMMUNITY): Payer: Self-pay | Admitting: *Deleted

## 2016-01-07 ENCOUNTER — Emergency Department (HOSPITAL_COMMUNITY)
Admission: EM | Admit: 2016-01-07 | Discharge: 2016-01-07 | Disposition: A | Discharge status: Home / Self Care | Payer: 59 | Attending: Emergency Medicine | Admitting: Emergency Medicine

## 2016-01-07 DIAGNOSIS — J209 Acute bronchitis, unspecified: Secondary | ICD-10-CM | POA: Diagnosis not present

## 2016-01-07 DIAGNOSIS — R05 Cough: Secondary | ICD-10-CM | POA: Diagnosis present

## 2016-01-07 DIAGNOSIS — J40 Bronchitis, not specified as acute or chronic: Secondary | ICD-10-CM

## 2016-01-07 DIAGNOSIS — Z87891 Personal history of nicotine dependence: Secondary | ICD-10-CM | POA: Insufficient documentation

## 2016-01-07 DIAGNOSIS — Z79899 Other long term (current) drug therapy: Secondary | ICD-10-CM | POA: Diagnosis not present

## 2016-01-07 DIAGNOSIS — I10 Essential (primary) hypertension: Secondary | ICD-10-CM | POA: Diagnosis not present

## 2016-01-07 DIAGNOSIS — R630 Anorexia: Secondary | ICD-10-CM | POA: Diagnosis not present

## 2016-01-07 DIAGNOSIS — R059 Cough, unspecified: Secondary | ICD-10-CM

## 2016-01-07 MED ORDER — ALBUTEROL SULFATE HFA 108 (90 BASE) MCG/ACT IN AERS: 2.0000 | INHALATION_SPRAY | Freq: Four times a day (QID) | RESPIRATORY_TRACT | Status: DC | PRN

## 2016-01-07 MED ORDER — AZITHROMYCIN 250 MG PO TABS
500.0000 mg | ORAL_TABLET | Freq: Once | ORAL | Status: AC
  Administered 2016-01-07: 500 mg via ORAL
  Filled 2016-01-07: qty 2

## 2016-01-07 MED ORDER — DEXAMETHASONE 4 MG PO TABS
16.0000 mg | ORAL_TABLET | Freq: Once | ORAL | Status: AC
  Administered 2016-01-07: 16 mg via ORAL
  Filled 2016-01-07: qty 4

## 2016-01-07 MED ORDER — AZITHROMYCIN 250 MG PO TABS: ORAL_TABLET | ORAL | Status: DC

## 2016-01-07 MED ORDER — ALBUTEROL SULFATE HFA 108 (90 BASE) MCG/ACT IN AERS
2.0000 | INHALATION_SPRAY | Freq: Once | RESPIRATORY_TRACT | Status: AC
  Administered 2016-01-07: 2 via RESPIRATORY_TRACT
  Filled 2016-01-07: qty 6.7

## 2016-01-07 MED ORDER — HYDROCOD POLST-CPM POLST ER 10-8 MG/5ML PO SUER
5.0000 mL | Freq: Two times a day (BID) | ORAL | Status: DC | PRN
Start: 2016-01-07 — End: 2016-07-26

## 2016-01-07 NOTE — ED Notes (Signed)
Pt c/o cough x 2 weeks. States she is coughing up phlegm.

## 2016-01-07 NOTE — ED Notes (Signed)
Pt stable, ambulatory, states understanding of discharge instructions 

## 2016-01-07 NOTE — ED Provider Notes (Addendum)
CSN: [id number]     Arrival date & time 01/07/16  0139 History   First MD Initiated Contact with Patient 01/07/16 [phone]     Chief Complaint  Patient presents with  . Cough     (Consider location/radiation/quality/duration/timing/severity/associated sxs/prior Treatment) Patient is a 60 y.o. female presenting with cough.  Cough Cough characteristics:  Non-productive Severity:  Mild Onset quality:  Gradual Duration:  3 weeks Timing:  Constant Progression:  Waxing and waning Associated symptoms: chest pain (while coughing), chills and fever     Past Medical History  Diagnosis Date  . Hypertension    History reviewed. No pertinent past surgical history. No family history on file. Social History  Substance Use Topics  . Smoking status: Former Games developermoker  . Smokeless tobacco: None  . Alcohol Use: No   OB History    No data available     Review of Systems  Constitutional: Positive for fever, chills and appetite change. Negative for fatigue.  Respiratory: Positive for cough.   Cardiovascular: Positive for chest pain (while coughing). Negative for palpitations and leg swelling.  Gastrointestinal: Negative for diarrhea.  Genitourinary: Negative for dysuria and dyspareunia.  Musculoskeletal: Negative for back pain.  All other systems reviewed and are negative.     Allergies  Review of patient's allergies indicates no known allergies.  Home Medications   Prior to Admission medications   Medication Sig Start Date End Date Taking? Authorizing Provider  losartan-hydrochlorothiazide (HYZAAR) 100-12.5 MG per tablet Take 1 tablet by mouth daily. 02/02/15  Yes Peyton Najjaravid H Hopper, MD  albuterol (PROVENTIL HFA;VENTOLIN HFA) 108 (90 Base) MCG/ACT inhaler Inhale 2 puffs into the lungs every 6 (six) hours as needed for wheezing or shortness of breath. 01/07/16   Marily MemosJason Ritha Sampedro, MD  azithromycin (ZITHROMAX) 250 MG tablet One daily until finished 01/08/16   Marily MemosJason Brantlee Hinde, MD   chlorpheniramine-HYDROcodone [facility](TUSSIONEX PENNKINETIC ER) 10-8 MG/5ML SUER Take 5 mLs by mouth every 12 (twelve) hours as needed for cough. 01/07/16   Marily MemosJason Caleesi Kohl, MD  diclofenac (VOLTAREN) 50 MG EC tablet Take 1 tablet (50 mg total) by mouth 2 (two) times daily. Patient not taking: Reported on 03/05/2015 02/02/15   Peyton Najjaravid H Hopper, MD  losartan (COZAAR) 100 MG tablet Take 1 tablet (100 mg total) by mouth daily. Patient not taking: Reported on 03/05/2015 03/31/14   Thao P Le, DO   BP 145/87 mmHg  Pulse 85  Temp(Src) 98.5 F (36.9 C) (Oral)  Resp 19  SpO2 93% Physical Exam  Constitutional: She is oriented to person, place, and time. She appears well-developed and well-nourished.  HENT:  Head: Normocephalic and atraumatic.  Neck: Normal range of motion.  Cardiovascular: Normal rate, regular rhythm and normal heart sounds.   Pulmonary/Chest: No stridor. No respiratory distress. She has wheezes (slight).  Abdominal: She exhibits no distension.  Musculoskeletal: Normal range of motion. She exhibits no edema or tenderness.  Neurological: She is alert and oriented to person, place, and time. No cranial nerve deficit.  Skin: Skin is warm and dry. No rash noted. No erythema.  Nursing note and vitals reviewed.   ED Course  Procedures (including critical care time) Labs Review Labs Reviewed - No data to display  Imaging Review Dg Chest 2 View  01/07/2016  CLINICAL DATA:  Initial evaluation for 3 week history of cough, fever. EXAM: CHEST  2 VIEW COMPARISON:  None available. FINDINGS: Cardiac and mediastinal silhouettes are within normal limits. Mild tortuosity the intrathoracic aorta noted. Lungs are normally inflated. Mild  diffuse peribronchial thickening present. No consolidative airspace disease. No pulmonary edema or pleural effusion. No pneumothorax. Mild irregular pleural thickening on the right. No acute osseous abnormality. Mild multilevel degenerative changes within the visualized spine.  IMPRESSION: Mild diffuse peribronchial thickening, nonspecific, but may reflect acute bronchiolitis in the setting of cough and fever. No consolidative opacity to suggest pneumonia. Electronically Signed   By: Rise Mu M.D.   On: 01/07/2016 05:49   I have personally reviewed and evaluated these images and lab results as part of my medical decision-making.   EKG Interpretation None      MDM   Final diagnoses:  Cough  Bronchitis   Cough with associated cp. Likely bronchitis. Also with elevated BP, h/o same, will recheck and refer to pcp. Will check cxr to ensure no consolidation, otherwise with 3 weeks of symptoms, change in phlegm color I will treat with abx, cough meds, steroids and an inhaler if xr is reassurring  CXR ok. Will tx for bronchitis.   New Prescriptions: Discharge Medication List as of 01/07/2016  6:53 AM    START taking these medications   Details  albuterol (PROVENTIL HFA;VENTOLIN HFA) 108 (90 Base) MCG/ACT inhaler Inhale 2 puffs into the lungs every 6 (six) hours as needed for wheezing or shortness of breath., Starting 01/07/2016, Until Discontinued, Print    azithromycin (ZITHROMAX) 250 MG tablet One daily until finished, Print    chlorpheniramine-HYDROcodone (TUSSIONEX PENNKINETIC ER) 10-8 MG/5ML SUER Take 5 mLs by mouth every 12 (twelve) hours as needed for cough., Starting 01/07/2016, Until Discontinued, Print         I have personally and contemperaneously reviewed labs and imaging and used in my decision making as above.   A medical screening exam was performed and I feel the patient has had an appropriate workup for their chief complaint at this time and likelihood of emergent condition existing is low. Their vital signs are stable. They have been counseled on decision, discharge, follow up and which symptoms necessitate immediate return to the emergency department.  They verbally stated understanding and agreement with plan and discharged in stable  condition.       Marily Memos, MD 01/07/16 1610  Marily Memos, MD 02/17/16 [phone]

## 2016-02-17 ENCOUNTER — Other Ambulatory Visit: Payer: Self-pay | Admitting: Family Medicine

## 2016-06-01 ENCOUNTER — Other Ambulatory Visit: Payer: Self-pay | Admitting: Family Medicine

## 2016-07-26 ENCOUNTER — Ambulatory Visit (INDEPENDENT_AMBULATORY_CARE_PROVIDER_SITE_OTHER): Payer: 59 | Admitting: Family Medicine

## 2016-07-26 VITALS — BP 154/102 | HR 61 | Temp 98.1°F | Resp 17 | Ht 65.0 in | Wt 211.0 lb

## 2016-07-26 DIAGNOSIS — E7800 Pure hypercholesterolemia, unspecified: Secondary | ICD-10-CM

## 2016-07-26 DIAGNOSIS — Z5181 Encounter for therapeutic drug level monitoring: Secondary | ICD-10-CM | POA: Diagnosis not present

## 2016-07-26 DIAGNOSIS — K760 Fatty (change of) liver, not elsewhere classified: Secondary | ICD-10-CM | POA: Diagnosis not present

## 2016-07-26 DIAGNOSIS — I10 Essential (primary) hypertension: Secondary | ICD-10-CM

## 2016-07-26 DIAGNOSIS — R7309 Other abnormal glucose: Secondary | ICD-10-CM | POA: Diagnosis not present

## 2016-07-26 MED ORDER — LOSARTAN POTASSIUM-HCTZ 100-25 MG PO TABS: 1.0000 | ORAL_TABLET | Freq: Every day | ORAL | 3 refills | Status: DC

## 2016-07-26 NOTE — Progress Notes (Addendum)
Subjective:  By signing my name below, I, Meredith Randolph, attest that this documentation has been prepared under the direction and in the presence of Meredith SorensonEva Algernon Mundie, MD. Electronically Signed: Stann Oresung-Kai Randolph, Scribe. 07/26/2016 , 12:02 PM .  Patient was seen in Room 9 .   Patient ID: Meredith Randolph, female    DOB: 02/11/1956, 60 y.o.   MRN: [id number] Chief Complaint  Patient presents with  . Hypertension    hyzaar    HPI Meredith EasternShirley B Randolph is a 60 y.o. female [facility]. Patient has a h/o HTN. She takes hyzaar 100-12.5mg  qd. She states she's been out of her medication for a few months. She was doing well on her regime: she walks daily, and has made progress in her diet. She denies cough, lightheadedness, or dizziness. She denies checking her BP at home. She mentions nocturia once a night.   She recently started taking Vitamins B-12 (liquid), C and D.   Past Medical History:  Diagnosis Date  . Hypertension    Prior to Admission medications   Medication Sig Start Date End Date Taking? Authorizing Provider  losartan-hydrochlorothiazide (HYZAAR) 100-12.5 MG tablet TAKE 1 TABLET BY MOUTH DAILY. 02/17/16  Yes Peyton Najjaravid H Hopper, MD  losartan (COZAAR) 100 MG tablet Take 1 tablet (100 mg total) by mouth daily. Patient not taking: Reported on 07/26/2016 03/31/14   Thao P Le, DO   No Known Allergies   No past surgical history on file. No family history on file. Social History   Social History  . Marital status: Married    Spouse name: N/A  . Number of children: N/A  . Years of education: N/A   Social History Main Topics  . Smoking status: Former Games developermoker  . Smokeless tobacco: None  . Alcohol use No  . Drug use: No  . Sexual activity: Not Asked   Other Topics Concern  . None   Social History Narrative   Employment: retired.   Depression screen PHQ 2/9 07/26/2016  Decreased Interest 0  Down, Depressed, Hopeless 0  PHQ - 2 Score 0    Review of Systems   Constitutional: Negative for fatigue and unexpected weight change.  Respiratory: Negative for chest tightness and shortness of breath.   Cardiovascular: Negative for chest pain, palpitations and leg swelling.  Gastrointestinal: Negative for abdominal pain and blood in stool.  Neurological: Negative for dizziness, syncope, light-headedness and headaches.       Objective:   Physical Exam  Constitutional: She is oriented to person, place, and time. She appears well-developed and well-nourished. No distress.  HENT:  Head: Normocephalic and atraumatic.  Eyes: Conjunctivae and EOM are normal. Pupils are equal, round, and reactive to light.  Neck: Neck supple. Carotid bruit is not present. No thyromegaly present.  Cardiovascular: Normal rate, regular rhythm, normal heart sounds and intact distal pulses.   No murmur heard. Pulmonary/Chest: Effort normal and breath sounds normal. No respiratory distress. She has no wheezes.  Abdominal: Soft. She exhibits no pulsatile midline mass. There is no tenderness.  Musculoskeletal: Normal range of motion.  Neurological: She is alert and oriented to person, place, and time.  Skin: Skin is warm and dry.  Psychiatric: She has a normal mood and affect. Her behavior is normal.  Nursing note and vitals reviewed.   BP (!) 154/102 (BP Location: Right Arm, Patient Position: Sitting, Cuff Size: Large)   Pulse 61   Temp 98.1 F (36.7 C) (Oral)   Resp  17   Ht 5\' 5"  (1.651 m)   Wt 211 lb (95.7 kg)   SpO2 97%   BMI 35.11 kg/m     Assessment & Plan:   1. Essential hypertension, benign - pt out of bp meds for sev mos - restart losartan-hctz but increase to 100-25 rather than the prior 100-12.5.  Reviewed med risks/benefits in detail. Does not have access to bp cuff at home so RTC in 1-2 mos to recheck bp and bmp.  2. Pure hypercholesterolemia   3. Medication monitoring encounter   4. Fatty liver - rec increase exercise and weight loss  5. Elevated glucose     rec scheduling full CPE at pt's convenience.  Orders Placed This Encounter  Procedures  . Comprehensive metabolic panel    Order Specific Question:   Has the patient fasted?    Answer:   Yes  . Lipid panel    Order Specific Question:   Has the patient fasted?    Answer:   Yes  . Hemoglobin A1c    Meds ordered this encounter  Medications  . losartan-hydrochlorothiazide (HYZAAR) 100-25 MG tablet    Sig: Take 1 tablet by mouth daily.    Dispense:  90 tablet    Refill:  3    I personally performed the services described in this documentation, which was scribed in my presence. The recorded information has been reviewed and considered, and addended by me as needed.   Meredith Sorenson, M.D.  [facility] [address] [place], [place] [address] [phone] phone [phone] fax  07/26/16 11:29 PM

## 2016-07-26 NOTE — Patient Instructions (Addendum)
Recommend coming back to clinic in 1-2 months to recheck blood pressure. It would be good to do a full physical at that time as well so we can make sure we are keeping your healthy.    IF you received an x-ray today, you will receive an invoice from [facility]. Please contact [facility] at [phone] with questions or concerns regarding your invoice.   IF you received labwork today, you will receive an invoice from [employer]. Please contact [facility] at [phone] with questions or concerns regarding your invoice.   Our billing staff will not be able to assist you with questions regarding bills from these companies.  You will be contacted with the lab results as soon as they are available. The fastest way to get your results is to activate your My Chart account. Instructions are located on the last page of this paperwork. If you have not heard from [place] regarding the results in 2 weeks, please contact this office.    Hypertension Hypertension, commonly called high blood pressure, is when the force of blood pumping through your arteries is too strong. Your arteries are the blood vessels that carry blood from your heart throughout your body. A blood pressure reading consists of a higher number over a lower number, such as 110/72. The higher number (systolic) is the pressure inside your arteries when your heart pumps. The lower number (diastolic) is the pressure inside your arteries when your heart relaxes. Ideally you want your blood pressure below 120/80. Hypertension forces your heart to work harder to pump blood. Your arteries may become narrow or stiff. Having untreated or uncontrolled hypertension can cause heart attack, stroke, kidney disease, and other problems. RISK FACTORS Some risk factors for high blood pressure are controllable. Others are not.  Risk factors you cannot control include:   Race. You may be at higher risk if you are African  American.  Age. Risk increases with age.  Gender. Men are at higher risk than women before age 23 years. After age 75, women are at higher risk than men. Risk factors you can control include:  Not getting enough exercise or physical activity.  Being overweight.  Getting too much fat, sugar, calories, or salt in your diet.  Drinking too much alcohol. SIGNS AND SYMPTOMS Hypertension does not usually cause signs or symptoms. Extremely high blood pressure (hypertensive crisis) may cause headache, anxiety, shortness of breath, and nosebleed. DIAGNOSIS To check if you have hypertension, your health care provider will measure your blood pressure while you are seated, with your arm held at the level of your heart. It should be measured at least twice using the same arm. Certain conditions can cause a difference in blood pressure between your right and left arms. A blood pressure reading that is higher than normal on one occasion does not mean that you need treatment. If it is not clear whether you have high blood pressure, you may be asked to return on a different day to have your blood pressure checked again. Or, you may be asked to monitor your blood pressure at home for 1 or more weeks. TREATMENT Treating high blood pressure includes making lifestyle changes and possibly taking medicine. Living a healthy lifestyle can help lower high blood pressure. You may need to change some of your habits. Lifestyle changes may include:  Following the DASH diet. This diet is high in fruits, vegetables, and whole grains. It is low in salt, red meat, and added sugars.  Keep  your sodium intake below 2,300 mg per day.  Getting at least 30-45 minutes of aerobic exercise at least 4 times per week.  Losing weight if necessary.  Not smoking.  Limiting alcoholic beverages.  Learning ways to reduce stress. Your health care provider may prescribe medicine if lifestyle changes are not enough to get your blood  pressure under control, and if one of the following is true:  You are 72-7 years of age and your systolic blood pressure is above 140.  You are 4 years of age or older, and your systolic blood pressure is above 150.  Your diastolic blood pressure is above 90.  You have diabetes, and your systolic blood pressure is over 140 or your diastolic blood pressure is over 90.  You have kidney disease and your blood pressure is above 140/90.  You have heart disease and your blood pressure is above 140/90. Your personal target blood pressure may vary depending on your medical conditions, your age, and other factors. HOME CARE INSTRUCTIONS  Have your blood pressure rechecked as directed by your health care provider.   Take medicines only as directed by your health care provider. Follow the directions carefully. Blood pressure medicines must be taken as prescribed. The medicine does not work as well when you skip doses. Skipping doses also puts you at risk for problems.  Do not smoke.   Monitor your blood pressure at home as directed by your health care provider. SEEK MEDICAL CARE IF:   You think you are having a reaction to medicines taken.  You have recurrent headaches or feel dizzy.  You have swelling in your ankles.  You have trouble with your vision. SEEK IMMEDIATE MEDICAL CARE IF:  You develop a severe headache or confusion.  You have unusual weakness, numbness, or feel faint.  You have severe chest or abdominal pain.  You vomit repeatedly.  You have trouble breathing. MAKE SURE YOU:   Understand these instructions.  Will watch your condition.  Will get help right away if you are not doing well or get worse.   This information is not intended to replace advice given to you by your health care provider. Make sure you discuss any questions you have with your health care provider.   Document Released: 10/03/2005 Document Revised: 02/17/2015 Document Reviewed:  07/26/2013 Elsevier Interactive Patient Education 2016 Elsevier Inc.   DASH Eating Plan DASH stands for "Dietary Approaches to Stop Hypertension." The DASH eating plan is a healthy eating plan that has been shown to reduce high blood pressure (hypertension). Additional health benefits may include reducing the risk of type 2 diabetes mellitus, heart disease, and stroke. The DASH eating plan may also help with weight loss. WHAT DO I NEED TO KNOW ABOUT THE DASH EATING PLAN? For the DASH eating plan, you will follow these general guidelines:  Choose foods with a percent daily value for sodium of less than 5% (as listed on the food label).  Use salt-free seasonings or herbs instead of table salt or sea salt.  Check with your health care provider or pharmacist before using salt substitutes.  Eat lower-sodium products, often labeled as "lower sodium" or "no salt added."  Eat fresh foods.  Eat more vegetables, fruits, and low-fat dairy products.  Choose whole grains. Look for the word "whole" as the first word in the ingredient list.  Choose fish and skinless chicken or [place] more often than red meat. Limit fish, poultry, and meat to 6 oz (170 g) each day.  Limit sweets, desserts, sugars, and sugary drinks.  Choose heart-healthy fats.  Limit cheese to 1 oz (28 g) per day.  Eat more home-cooked food and less restaurant, buffet, and fast food.  Limit fried foods.  Cook foods using methods other than frying.  Limit canned vegetables. If you do use them, rinse them well to decrease the sodium.  When eating at a restaurant, ask that your food be prepared with less salt, or no salt if possible. WHAT FOODS CAN I EAT? Seek help from a dietitian for individual calorie needs. Grains Whole grain or whole wheat bread. Brown rice. Whole grain or whole wheat pasta. Quinoa, bulgur, and whole grain cereals. Low-sodium cereals. Corn or whole wheat flour tortillas. Whole grain cornbread. Whole  grain crackers. Low-sodium crackers. Vegetables Fresh or frozen vegetables (raw, steamed, roasted, or grilled). Low-sodium or reduced-sodium tomato and vegetable juices. Low-sodium or reduced-sodium tomato sauce and paste. Low-sodium or reduced-sodium canned vegetables.  Fruits All fresh, canned (in natural juice), or frozen fruits. Meat and Other Protein Products Ground beef (85% or leaner), grass-fed beef, or beef trimmed of fat. Skinless chicken or [place]. Ground chicken or [place]. Pork trimmed of fat. All fish and seafood. Eggs. Dried beans, peas, or lentils. Unsalted nuts and seeds. Unsalted canned beans. Dairy Low-fat dairy products, such as skim or 1% milk, 2% or reduced-fat cheeses, low-fat ricotta or cottage cheese, or plain low-fat yogurt. Low-sodium or reduced-sodium cheeses. Fats and Oils Tub margarines without trans fats. Light or reduced-fat mayonnaise and salad dressings (reduced sodium). Avocado. Safflower, olive, or canola oils. Natural peanut or almond butter. Other Unsalted popcorn and pretzels. The items listed above may not be a complete list of recommended foods or beverages. Contact your dietitian for more options. WHAT FOODS ARE NOT RECOMMENDED? Grains White bread. White pasta. White rice. Refined cornbread. Bagels and croissants. Crackers that contain trans fat. Vegetables Creamed or fried vegetables. Vegetables in a cheese sauce. Regular canned vegetables. Regular canned tomato sauce and paste. Regular tomato and vegetable juices. Fruits Dried fruits. Canned fruit in light or heavy syrup. Fruit juice. Meat and Other Protein Products Fatty cuts of meat. Ribs, chicken wings, bacon, sausage, bologna, salami, chitterlings, fatback, hot dogs, bratwurst, and packaged luncheon meats. Salted nuts and seeds. Canned beans with salt. Dairy Whole or 2% milk, cream, half-and-half, and cream cheese. Whole-fat or sweetened yogurt. Full-fat cheeses or blue cheese. Nondairy creamers  and whipped toppings. Processed cheese, cheese spreads, or cheese curds. Condiments Onion and garlic salt, seasoned salt, table salt, and sea salt. Canned and packaged gravies. Worcestershire sauce. Tartar sauce. Barbecue sauce. Teriyaki sauce. Soy sauce, including reduced sodium. Steak sauce. Fish sauce. Oyster sauce. Cocktail sauce. Horseradish. Ketchup and mustard. Meat flavorings and tenderizers. Bouillon cubes. Hot sauce. Tabasco sauce. Marinades. Taco seasonings. Relishes. Fats and Oils Butter, stick margarine, lard, shortening, ghee, and bacon fat. Coconut, palm kernel, or palm oils. Regular salad dressings. Other Pickles and olives. Salted popcorn and pretzels. The items listed above may not be a complete list of foods and beverages to avoid. Contact your dietitian for more information. WHERE CAN I FIND MORE INFORMATION? National Heart, Lung, and Blood Institute: [employer]   This information is not intended to replace advice given to you by your health care provider. Make sure you discuss any questions you have with your health care provider.   Document Released: 09/22/2011 Document Revised: 10/24/2014 Document Reviewed: 08/07/2013 Elsevier Interactive Patient Education 2016 Elsevier Inc. Fat and Cholesterol Restricted Diet High levels of fat  and cholesterol in your blood may lead to various health problems, such as diseases of the heart, blood vessels, gallbladder, liver, and pancreas. Fats are concentrated sources of energy that come in various forms. Certain types of fat, including saturated fat, may be harmful in excess. Cholesterol is a substance needed by your body in small amounts. Your body makes all the cholesterol it needs. Excess cholesterol comes from the food you eat. When you have high levels of cholesterol and saturated fat in your blood, health problems can develop because the excess fat and cholesterol will gather along the walls of  your blood vessels, causing them to narrow. Choosing the right foods will help you control your intake of fat and cholesterol. This will help keep the levels of these substances in your blood within normal limits and reduce your risk of disease. WHAT IS MY PLAN? Your health care provider recommends that you:  Get no more than __________ % of the total calories in your daily diet from fat.  Limit your intake of saturated fat to less than ______% of your total calories each day.  Limit the amount of cholesterol in your diet to less than _________mg per day. WHAT TYPES OF FAT SHOULD I CHOOSE?  Choose healthy fats more often. Choose monounsaturated and polyunsaturated fats, such as olive and canola oil, flaxseeds, walnuts, almonds, and seeds.  Eat more omega-3 fats. Good choices include salmon, mackerel, sardines, tuna, flaxseed oil, and ground flaxseeds. Aim to eat fish at least two times a week.  Limit saturated fats. Saturated fats are primarily found in animal products, such as meats, butter, and cream. Plant sources of saturated fats include palm oil, palm kernel oil, and coconut oil.  Avoid foods with partially hydrogenated oils in them. These contain trans fats. Examples of foods that contain trans fats are stick margarine, some tub margarines, cookies, crackers, and other baked goods. WHAT GENERAL GUIDELINES DO I NEED TO FOLLOW? These guidelines for healthy eating will help you control your intake of fat and cholesterol:  Check food labels carefully to identify foods with trans fats or high amounts of saturated fat.  Fill one half of your plate with vegetables and green salads.  Fill one fourth of your plate with whole grains. Look for the word "whole" as the first word in the ingredient list.  Fill one fourth of your plate with lean protein foods.  Limit fruit to two servings a day. Choose fruit instead of juice.  Eat more foods that contain soluble fiber. Examples of foods that  contain this type of fiber are apples, broccoli, carrots, beans, peas, and barley. Aim to get 20-30 g of fiber per day.  Eat more home-cooked food and less restaurant, buffet, and fast food.  Limit or avoid alcohol.  Limit foods high in starch and sugar.  Limit fried foods.  Cook foods using methods other than frying. Baking, boiling, grilling, and broiling are all great options.  Lose weight if you are overweight. Losing just 5-10% of your initial body weight can help your overall health and prevent diseases such as diabetes and heart disease. WHAT FOODS CAN I EAT? Grains Whole grains, such as whole wheat or whole grain breads, crackers, cereals, and pasta. Unsweetened oatmeal, bulgur, barley, quinoa, or brown rice. Corn or whole wheat flour tortillas. Vegetables Fresh or frozen vegetables (raw, steamed, roasted, or grilled). Green salads. Fruits All fresh, canned (in natural juice), or frozen fruits. Meat and Other Protein Products Ground beef (85% or leaner),  grass-fed beef, or beef trimmed of fat. Skinless chicken or [place]. Ground chicken or [place]. Pork trimmed of fat. All fish and seafood. Eggs. Dried beans, peas, or lentils. Unsalted nuts or seeds. Unsalted canned or dry beans. Dairy Low-fat dairy products, such as skim or 1% milk, 2% or reduced-fat cheeses, low-fat ricotta or cottage cheese, or plain low-fat yogurt. Fats and Oils Tub margarines without trans fats. Light or reduced-fat mayonnaise and salad dressings. Avocado. Olive, canola, sesame, or safflower oils. Natural peanut or almond butter (choose ones without added sugar and oil). The items listed above may not be a complete list of recommended foods or beverages. Contact your dietitian for more options. WHAT FOODS ARE NOT RECOMMENDED? Grains White bread. White pasta. White rice. Cornbread. Bagels, pastries, and croissants. Crackers that contain trans fat. Vegetables White potatoes. Corn. Creamed or fried vegetables.  Vegetables in a cheese sauce. Fruits Dried fruits. Canned fruit in light or heavy syrup. Fruit juice. Meat and Other Protein Products Fatty cuts of meat. Ribs, chicken wings, bacon, sausage, bologna, salami, chitterlings, fatback, hot dogs, bratwurst, and packaged luncheon meats. Liver and organ meats. Dairy Whole or 2% milk, cream, half-and-half, and cream cheese. Whole milk cheeses. Whole-fat or sweetened yogurt. Full-fat cheeses. Nondairy creamers and whipped toppings. Processed cheese, cheese spreads, or cheese curds. Sweets and Desserts Corn syrup, sugars, honey, and molasses. Candy. Jam and jelly. Syrup. Sweetened cereals. Cookies, pies, cakes, donuts, muffins, and ice cream. Fats and Oils Butter, stick margarine, lard, shortening, ghee, or bacon fat. Coconut, palm kernel, or palm oils. Beverages Alcohol. Sweetened drinks (such as sodas, lemonade, and fruit drinks or punches). The items listed above may not be a complete list of foods and beverages to avoid. Contact your dietitian for more information.   This information is not intended to replace advice given to you by your health care provider. Make sure you discuss any questions you have with your health care provider.   Document Released: 10/03/2005 Document Revised: 10/24/2014 Document Reviewed: 01/01/2014 Elsevier Interactive Patient Education [employer]

## 2016-07-27 LAB — LIPID PANEL
CHOL/HDL RATIO: 2.7 ratio (ref <=5.0)
Cholesterol: 219 mg/dL — ABNORMAL HIGH (ref 125–200)
HDL: 81 mg/dL (ref >=46)
LDL Cholesterol: 122 mg/dL (ref <130)
Triglycerides: 79 mg/dL (ref <150)
VLDL: 16 mg/dL (ref <30)

## 2016-07-27 LAB — COMPREHENSIVE METABOLIC PANEL
ALT: 56 U/L — AB (ref 6–29)
AST: 36 U/L — ABNORMAL HIGH (ref 10–35)
Albumin: 4 g/dL (ref 3.6–5.1)
Alkaline Phosphatase: 136 U/L — ABNORMAL HIGH (ref 33–130)
BUN: 15 mg/dL (ref 7–25)
CHLORIDE: 106 mmol/L (ref 98–110)
CO2: 26 mmol/L (ref 20–31)
Calcium: 9.3 mg/dL (ref 8.6–10.4)
Creat: 0.98 mg/dL (ref 0.50–0.99)
GLUCOSE: 79 mg/dL (ref 65–99)
POTASSIUM: 4.3 mmol/L (ref 3.5–5.3)
Sodium: 142 mmol/L (ref 135–146)
Total Bilirubin: 0.5 mg/dL (ref 0.2–1.2)
Total Protein: 7 g/dL (ref 6.1–8.1)

## 2016-07-27 LAB — HEMOGLOBIN A1C
HEMOGLOBIN A1C: 5.4 % (ref <5.7)
MEAN PLASMA GLUCOSE: 108 mg/dL

## 2017-02-06 DIAGNOSIS — I10 Essential (primary) hypertension: Secondary | ICD-10-CM | POA: Insufficient documentation

## 2017-02-06 DIAGNOSIS — J019 Acute sinusitis, unspecified: Secondary | ICD-10-CM | POA: Diagnosis not present

## 2017-02-06 DIAGNOSIS — R05 Cough: Secondary | ICD-10-CM | POA: Diagnosis not present

## 2017-02-16 ENCOUNTER — Ambulatory Visit (INDEPENDENT_AMBULATORY_CARE_PROVIDER_SITE_OTHER): Payer: 59

## 2017-02-16 ENCOUNTER — Encounter: Payer: Self-pay | Admitting: Physician Assistant

## 2017-02-16 ENCOUNTER — Ambulatory Visit (INDEPENDENT_AMBULATORY_CARE_PROVIDER_SITE_OTHER): Payer: 59 | Admitting: Physician Assistant

## 2017-02-16 VITALS — BP 144/92 | HR 65 | Temp 98.5°F | Resp 16 | Ht 65.0 in | Wt 216.4 lb

## 2017-02-16 DIAGNOSIS — R05 Cough: Secondary | ICD-10-CM | POA: Diagnosis not present

## 2017-02-16 DIAGNOSIS — I10 Essential (primary) hypertension: Secondary | ICD-10-CM

## 2017-02-16 DIAGNOSIS — R059 Cough, unspecified: Secondary | ICD-10-CM

## 2017-02-16 MED ORDER — HYDROCOD POLST-CPM POLST ER 10-8 MG/5ML PO SUER: 5.0000 mL | Freq: Every evening | ORAL | 0 refills | Status: DC | PRN

## 2017-02-16 MED ORDER — ALBUTEROL SULFATE HFA 108 (90 BASE) MCG/ACT IN AERS: 2.0000 | INHALATION_SPRAY | RESPIRATORY_TRACT | 1 refills | Status: DC | PRN

## 2017-02-16 MED ORDER — BENZONATATE 200 MG PO CAPS: 200.0000 mg | ORAL_CAPSULE | Freq: Three times a day (TID) | ORAL | 0 refills | Status: DC | PRN

## 2017-02-16 NOTE — Progress Notes (Signed)
Patient ID: Meredith Randolph, female    DOB: 09/14/1956, 61 y.o.   MRN: [id number]  PCP: No PCP Per Patient (uses this clinic, has not identified one single provider)  Chief Complaint  Patient presents with  . Cough    Pt dx w/ bronchitis - was on Antibx starting 4/23 but cough has lingered     Subjective:   Presents for evaluation of persistent cough.  Was initially evaluated on 02/06/2017 at another facility with fever, chills, congestion, drainage and productive cough. Diagnosed with bronchitis and treated with Augmentin. All the symptoms resolved except the cough. The cough occurs in episodes, and is triggered by taking a deep breath. She feels like there is "something in there," pointing to the upper sternal area. Intermittently productive of clear sputum, though this morning it was light yellow.  Tessalon perles help only minimally.  She relates a similar episode 12/2015, at which time an inhaler really helped, and she wonders if that may help again now.   Review of Systems  Constitutional: Negative.   HENT: Negative for congestion, ear pain, postnasal drip, rhinorrhea, sinus pain, sinus pressure, sneezing, sore throat and trouble swallowing.   Eyes: Negative for visual disturbance.  Respiratory: Positive for cough. Negative for choking, chest tightness, shortness of breath, wheezing and stridor.   Cardiovascular: Negative for chest pain and palpitations.  Gastrointestinal: Negative for abdominal pain, diarrhea, nausea and vomiting.  Genitourinary: Negative for dysuria, frequency, hematuria and urgency.  Musculoskeletal: Negative for arthralgias and myalgias.  Skin: Negative for rash.  Neurological: Negative for dizziness, weakness and headaches.  Psychiatric/Behavioral: Negative for decreased concentration. The patient is not nervous/anxious.        Patient Active Problem List   Diagnosis Date Noted  . Hyperlipidemia 02/02/2015  . Essential hypertension, benign  05/30/2013     Prior to Admission medications   Medication Sig Start Date End Date Taking? Authorizing Provider  benzonatate (TESSALON) 200 MG capsule Take 200 mg by mouth 3 (three) times daily as needed for cough.   Yes Historical Provider, MD  losartan-hydrochlorothiazide (HYZAAR) 100-25 MG tablet Take 1 tablet by mouth daily. 07/26/16  Yes Sherren MochaEva N Shaw, MD     No Known Allergies     Objective:  Physical Exam  Constitutional: She is oriented to person, place, and time. She appears well-developed and well-nourished. She is active and cooperative. No distress.  BP (!) 151/62   Pulse 65   Temp 98.5 F (36.9 C) (Oral)   Resp 16   Ht 5\' 5"  (1.651 m)   Wt 216 lb 6.4 oz (98.2 kg)   SpO2 96%   BMI 36.01 kg/m   HENT:  Head: Normocephalic and atraumatic.  Right Ear: Hearing, tympanic membrane, external ear and ear canal normal.  Left Ear: Hearing, tympanic membrane, external ear and ear canal normal.  Nose: Nose normal.  Mouth/Throat: Uvula is midline, oropharynx is clear and moist and mucous membranes are normal. Normal dentition.  Eyes: Conjunctivae are normal. No scleral icterus.  Neck: Normal range of motion, full passive range of motion without pain and phonation normal. Neck supple. No thyromegaly present.  Cardiovascular: Normal rate, regular rhythm and normal heart sounds.   Pulses:      Radial pulses are 2+ on the right side, and 2+ on the left side.  Pulmonary/Chest: Effort normal and breath sounds normal.  Lymphadenopathy:       Head (right side): No tonsillar, no preauricular, no posterior auricular and no occipital  adenopathy present.       Head (left side): No tonsillar, no preauricular, no posterior auricular and no occipital adenopathy present.    She has no cervical adenopathy.       Right: No supraclavicular adenopathy present.       Left: No supraclavicular adenopathy present.  Neurological: She is alert and oriented to person, place, and time. No sensory  deficit.  Skin: Skin is warm, dry and intact. No rash noted. No cyanosis or erythema. Nails show no clubbing.  Psychiatric: She has a normal mood and affect. Her speech is normal and behavior is normal.    Dg Chest 2 View  Result Date: 02/16/2017 CLINICAL DATA:  Two weeks of cough, history of hypertension, hyperlipidemia, former smoker. EXAM: CHEST  2 VIEW COMPARISON:  Chest x-ray of January 07, 2016 FINDINGS: The lungs are adequately inflated. There is no focal infiltrate. The interstitial markings are coarse though stable. There is no pleural effusion. The heart and pulmonary vascularity are normal. The mediastinum is normal in width. There is faint calcification in the wall of the aortic arch. There is mild multilevel degenerative disc disease of the mid thoracic spine. IMPRESSION: Mild chronic bronchitic-smoking related changes, stable. No acute cardiopulmonary abnormality. Electronically Signed   By: David  [place] M.D.   On: 02/16/2017 09:26          Assessment & Plan:   Problem List Items Addressed This Visit    Essential hypertension, benign    Likely elevated due to cough. RTC for re-evaluation when well. Update fasting labs and address care gaps at that time.       Other Visit Diagnoses    Cough    -  Primary   Post-infection cough. OK to continue Tessalon perles. Add albuterol inhaler, which helped previously, though no wheezes appreciated on exam. Tussionex at HS.   Relevant Medications   albuterol (PROVENTIL HFA;VENTOLIN HFA) 108 (90 Base) MCG/ACT inhaler   chlorpheniramine-HYDROcodone (TUSSIONEX PENNKINETIC ER) 10-8 MG/5ML SUER   benzonatate (TESSALON) 200 MG capsule   Other Relevant Orders   DG Chest 2 View (Completed)       Return for re-evaluation of blood pressure and to address care gaps when well.   Fernande Bras, PA-C [facility]

## 2017-02-16 NOTE — Patient Instructions (Addendum)
Your blood pressure is elevated today, likely because of the coughing. We need to recheck it when you are well, to make sure that it improves.  Please schedule a visit with myself or Dr. Norberto SorensonEva Shaw in the next several weeks for a blood pressure recheck. At that visit, we can update your lab work and address your other care gaps.    IF you received an x-ray today, you will receive an invoice [facility]. Please contact [facility] at [phone] with questions or concerns regarding your invoice.   IF you received labwork today, you will receive an invoice from [facility]. Please contact [facility] at [phone] with questions or concerns regarding your invoice.   Our billing staff will not be able to assist you with questions regarding bills from these companies.  You will be contacted with the lab results as soon as they are available. The fastest way to get your results is to activate your My Chart account. Instructions are located on the last page of this paperwork. If you have not heard from us regarding the results in 2 weeks, please contact this office.

## 2017-02-16 NOTE — Assessment & Plan Note (Signed)
Likely elevated due to cough. RTC for re-evaluation when well. Update fasting labs and address care gaps at that time.

## 2017-03-03 ENCOUNTER — Ambulatory Visit: Payer: 59 | Admitting: Physician Assistant

## 2017-08-17 ENCOUNTER — Other Ambulatory Visit: Payer: Self-pay | Admitting: Family Medicine

## 2017-08-30 ENCOUNTER — Other Ambulatory Visit: Payer: Self-pay

## 2017-08-30 ENCOUNTER — Encounter: Payer: Self-pay | Admitting: Physician Assistant

## 2017-08-30 ENCOUNTER — Ambulatory Visit: Payer: 59 | Admitting: Physician Assistant

## 2017-08-30 VITALS — BP 138/88 | HR 75 | Temp 98.8°F | Resp 18 | Ht 65.0 in | Wt 217.0 lb

## 2017-08-30 DIAGNOSIS — H8110 Benign paroxysmal vertigo, unspecified ear: Secondary | ICD-10-CM | POA: Diagnosis not present

## 2017-08-30 DIAGNOSIS — R42 Dizziness and giddiness: Secondary | ICD-10-CM | POA: Diagnosis not present

## 2017-08-30 MED ORDER — ONDANSETRON 4 MG PO TBDP: 4.0000 mg | ORAL_TABLET | Freq: Three times a day (TID) | ORAL | 0 refills | Status: DC | PRN

## 2017-08-30 MED ORDER — MECLIZINE HCL 25 MG PO TABS: 25.0000 mg | ORAL_TABLET | Freq: Three times a day (TID) | ORAL | 0 refills | Status: DC | PRN

## 2017-08-30 NOTE — Progress Notes (Signed)
PRIMARY CARE AT [facility] [address], [place] [address] [phone]  Date:  08/30/2017   Name:  Meredith Randolph   DOB:  [birth year]   MRN:  [id number]  PCP:  Harrison Mons, PA-C    History of Present Illness:  Meredith Randolph is a 61 y.o. female patient who presents to PCP with  Chief Complaint  Patient presents with  . Dizziness    since monday morning felt like vertigo and pt states she is feeling better today   . Nausea     Early morning, where she got up and felt like the room spinning.  Every time she attempted to get up or change position and the dizziness would continue.   No numbness or tinglijng,  Palpitations, chest pains.  She takes her blood pressure intermittently, but states that she did not take too many pills, where accidentally, could double antihypertensives.  No fever.  No dysuria.  She has a history of anemia years ago.  No blood or black stool.   No UR symptoms of nasal congestion, sneezing, ear discomfort, coughing.   No change in her appetite.  No emesis.    Patient Active Problem List   Diagnosis Date Noted  . Hyperlipidemia 02/02/2015  . Essential hypertension, benign 05/30/2013    Past Medical History:  Diagnosis Date  . Hypertension     No past surgical history on file.  Social History   Tobacco Use  . Smoking status: Former Research scientist (life sciences)  . Smokeless tobacco: Never Used  Substance Use Topics  . Alcohol use: No  . Drug use: No    No family history on file.  No Known Allergies  Medication list has been reviewed and updated.  Current Outpatient Medications on File Prior to Visit  Medication Sig Dispense Refill  . losartan-hydrochlorothiazide (HYZAAR) 100-25 MG tablet Take 1 tablet by mouth daily. 90 tablet 3  . albuterol (PROVENTIL HFA;VENTOLIN HFA) 108 (90 Base) MCG/ACT inhaler Inhale 2 puffs into the lungs every 4 (four) hours as needed for wheezing or shortness of breath (cough, shortness of breath or wheezing.). (Patient not  taking: Reported on 08/30/2017) 1 Inhaler 1  . benzonatate (TESSALON) 200 MG capsule Take 1 capsule (200 mg total) by mouth 3 (three) times daily as needed for cough. (Patient not taking: Reported on 08/30/2017) 20 capsule 0  . chlorpheniramine-HYDROcodone (TUSSIONEX PENNKINETIC ER) 10-8 MG/5ML SUER Take 5 mLs by mouth at bedtime as needed for cough. (Patient not taking: Reported on 08/30/2017) 60 mL 0   No current facility-administered medications on file prior to visit.     ROS ROS otherwise unremarkable unless listed above.  Physical Examination: BP 138/88   Pulse 75   Temp 98.8 F (37.1 C) (Oral)   Resp 18   Ht '5\' 5"'  (1.651 m)   Wt 217 lb (98.4 kg)   SpO2 100%   BMI 36.11 kg/m  Ideal Body Weight: Weight in (lb) to have BMI = 25: 149.9  Physical Exam  Constitutional: She is oriented to person, place, and time. She appears well-developed and well-nourished. No distress.  HENT:  Head: Normocephalic and atraumatic.  Right Ear: External ear normal.  Left Ear: External ear normal.  Eyes: Conjunctivae and EOM are normal. Pupils are equal, round, and reactive to light.  Cardiovascular: Normal rate.  Pulmonary/Chest: Effort normal. No respiratory distress.  Neurological: She is alert and oriented to person, place, and time. No cranial nerve deficit. Coordination and gait normal.  Normal strength of  UE and LEs Some nystagmus appreciated with maneuvering.  Skin: She is not diaphoretic.  Psychiatric: She has a normal mood and affect. Her behavior is normal.     Assessment and Plan: Meredith Randolph is a 61 y.o. female who is here today for cc of  Chief Complaint  Patient presents with  . Dizziness    since monday morning felt like vertigo and pt states she is feeling better today   . Nausea   Benign paroxysmal positional vertigo, unspecified laterality - Plan: meclizine (ANTIVERT) 25 MG tablet, ondansetron (ZOFRAN ODT) 4 MG disintegrating tablet  Dizziness - Plan: CBC,  CMP14+EGFR  Ivar Drape, PA-C [facility] 11/20/201810:38 AM

## 2017-08-30 NOTE — Patient Instructions (Signed)

## 2017-08-31 LAB — CMP14+EGFR
ALBUMIN: 4.5 g/dL (ref 3.6–4.8)
ALK PHOS: 163 IU/L — AB (ref 39–117)
ALT: 23 IU/L (ref 0–32)
AST: 19 IU/L (ref 0–40)
Albumin/Globulin Ratio: 1.4 (ref 1.2–2.2)
BILIRUBIN TOTAL: 0.4 mg/dL (ref 0.0–1.2)
BUN / CREAT RATIO: 16 (ref 12–28)
BUN: 16 mg/dL (ref 8–27)
CHLORIDE: 97 mmol/L (ref 96–106)
CO2: 29 mmol/L (ref 20–29)
Calcium: 9.7 mg/dL (ref 8.7–10.3)
Creatinine, Ser: 1.01 mg/dL — ABNORMAL HIGH (ref 0.57–1.00)
GFR calc Af Amer: 69 mL/min/{1.73_m2} (ref >59)
GFR calc non Af Amer: 60 mL/min/{1.73_m2} (ref >59)
GLUCOSE: 86 mg/dL (ref 65–99)
Globulin, Total: 3.2 g/dL (ref 1.5–4.5)
Potassium: 3.5 mmol/L (ref 3.5–5.2)
Sodium: 140 mmol/L (ref 134–144)
Total Protein: 7.7 g/dL (ref 6.0–8.5)

## 2017-08-31 LAB — CBC
HEMATOCRIT: 43.1 % (ref 34.0–46.6)
HEMOGLOBIN: 14.4 g/dL (ref 11.1–15.9)
MCH: 29.8 pg (ref 26.6–33.0)
MCHC: 33.4 g/dL (ref 31.5–35.7)
MCV: 89 fL (ref 79–97)
Platelets: 242 10*3/uL (ref 150–379)
RBC: 4.84 x10E6/uL (ref 3.77–5.28)
RDW: 14.2 % (ref 12.3–15.4)
WBC: 6.7 10*3/uL (ref 3.4–10.8)

## 2017-09-01 ENCOUNTER — Encounter (HOSPITAL_COMMUNITY): Payer: Self-pay | Admitting: Emergency Medicine

## 2017-09-01 ENCOUNTER — Ambulatory Visit (HOSPITAL_COMMUNITY)
Admission: EM | Admit: 2017-09-01 | Discharge: 2017-09-01 | Disposition: A | Discharge status: Home / Self Care | Payer: Self-pay | Attending: Internal Medicine | Admitting: Internal Medicine

## 2017-09-01 ENCOUNTER — Ambulatory Visit (INDEPENDENT_AMBULATORY_CARE_PROVIDER_SITE_OTHER): Payer: Self-pay

## 2017-09-01 DIAGNOSIS — S299XXA Unspecified injury of thorax, initial encounter: Secondary | ICD-10-CM | POA: Diagnosis not present

## 2017-09-01 DIAGNOSIS — R079 Chest pain, unspecified: Secondary | ICD-10-CM | POA: Diagnosis not present

## 2017-09-01 DIAGNOSIS — R0789 Other chest pain: Secondary | ICD-10-CM
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
## 2017-09-01 NOTE — Discharge Instructions (Signed)
Ibuprofen for pain control, ice/heat application may be helpful. Activity as tolerated. May have soreness for the next 1-2 weeks. If symptoms worsen or do not improve in the next 1-2 weeks to return to be seen or to follow up with PCP.
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
## 2017-09-13 ENCOUNTER — Other Ambulatory Visit: Payer: Self-pay | Admitting: *Deleted

## 2017-09-13 ENCOUNTER — Telehealth: Payer: Self-pay | Admitting: Physician Assistant

## 2017-09-13 MED ORDER — LOSARTAN POTASSIUM-HCTZ 100-25 MG PO TABS: 1.0000 | ORAL_TABLET | Freq: Every day | ORAL | 3 refills | Status: DC

## 2017-09-13 NOTE — Telephone Encounter (Signed)
CALLED PATIENT  ABOUT  MEDICATION  REFILL   SHE STILL  USES  [facility]  [address]   SHE  WANTS  A  REFILL

## 2017-09-13 NOTE — Telephone Encounter (Signed)
Copied from CRM [phone]#[id number]. Topic: Quick Communication - See Telephone Encounter >> Sep 13, 2017  3:47 PM Everardo PacificMoton, Adaira Centola, [place] wrote: CRM for notification. See Telephone encounter for: Patient called because she needs a refill on her Losartan. If someone could give her a call back about this matter.  09/13/17.

## 2018-01-22 ENCOUNTER — Encounter: Payer: Self-pay | Admitting: Physician Assistant

## 2018-01-24 ENCOUNTER — Encounter: Payer: Self-pay | Admitting: Physician Assistant

## 2018-08-10 DIAGNOSIS — Z23 Encounter for immunization: Secondary | ICD-10-CM | POA: Diagnosis not present

## 2019-04-10 ENCOUNTER — Telehealth: Payer: Self-pay | Admitting: General Practice

## 2019-04-10 NOTE — Telephone Encounter (Signed)
Medication Refill - Medication: losartan-hydrochlorothiazide (HYZAAR) 100-25 MG tablet [[id number]]    Has the patient contacted their pharmacy? Yes.   (Agent: If yes, when and what did the pharmacy advise?) the pharmacy has request this medication from the office with no response  Preferred Pharmacy (with phone number or street name):  [id number] [address], [place]. [phone] (Phone) [phone] (Fax)     Agent: Please be advised that RX refills may take up to 3 business days. We ask that you follow-up with your pharmacy.

## 2019-04-15 NOTE — Telephone Encounter (Signed)
Will discuss at appointment tomorrow.  

## 2019-04-16 ENCOUNTER — Encounter: Payer: Self-pay | Admitting: Registered Nurse

## 2019-04-16 ENCOUNTER — Other Ambulatory Visit: Payer: Self-pay

## 2019-04-16 ENCOUNTER — Ambulatory Visit: Payer: 59 | Admitting: Registered Nurse

## 2019-04-16 VITALS — BP 150/82 | HR 85 | Temp 98.4°F | Resp 18 | Wt 223.0 lb

## 2019-04-16 DIAGNOSIS — Z13220 Encounter for screening for lipoid disorders: Secondary | ICD-10-CM | POA: Diagnosis not present

## 2019-04-16 DIAGNOSIS — Z0001 Encounter for general adult medical examination with abnormal findings: Secondary | ICD-10-CM

## 2019-04-16 DIAGNOSIS — I10 Essential (primary) hypertension: Secondary | ICD-10-CM

## 2019-04-16 DIAGNOSIS — Z7689 Persons encountering health services in other specified circumstances: Secondary | ICD-10-CM

## 2019-04-16 DIAGNOSIS — Z13228 Encounter for screening for other metabolic disorders: Secondary | ICD-10-CM

## 2019-04-16 DIAGNOSIS — Z130 Encounter for screening for diseases of the blood and blood-forming organs and certain disorders involving the immune mechanism: Secondary | ICD-10-CM

## 2019-04-16 DIAGNOSIS — Z1329 Encounter for screening for other suspected endocrine disorder: Secondary | ICD-10-CM

## 2019-04-16 LAB — LIPID PANEL

## 2019-04-16 MED ORDER — LOSARTAN POTASSIUM-HCTZ 100-25 MG PO TABS: 1.0000 | ORAL_TABLET | Freq: Every day | ORAL | 0 refills | Status: DC

## 2019-04-16 NOTE — Progress Notes (Signed)
Established Patient Office Visit  Subjective:  Patient ID: Meredith Randolph, female    DOB: 02-16-1956  Age: 63 y.o. MRN: [id number]  CC:  Chief Complaint  Patient presents with  . Annual Exam    HPI Meredith Randolph presents for visit to establish care.  She has a medical hx significant for HTN. This has been controlled by HYZAAR 100-25mg  PO qd. She ran out of this medication around one week ago. Her BP today in office is elevated to 150/82. She does not monitor her BP at home but she has been asymptomatic.  She has no other complaints at this time.  She is overdue for a pap and mammogram. She states that she had a colonoscopy within 10 years that was normal. She will return in the upcoming months for CPE and referrals for these screenings.  Past Medical History:  Diagnosis Date  . Hypertension     History reviewed. No pertinent surgical history.  Family History  Problem Relation Age of Onset  . Lung cancer Father        smoker    Social History   Socioeconomic History  . Marital status: Married    Spouse name: Not on file  . Number of children: 1  . Years of education: Not on file  . Highest education level: Not on file  Occupational History  . Occupation: retired  Scientific laboratory technician  . Financial resource strain: Not hard at all  . Food insecurity    Worry: Never true    Inability: Never true  . Transportation needs    Medical: No    Non-medical: No  Tobacco Use  . Smoking status: Former Smoker    Packs/day: 1.00    Years: 20.00    Pack years: 20.00    Start date: 04/16/1979    Quit date: 04/16/1999    Years since quitting: 20.0  . Smokeless tobacco: Never Used  Substance and Sexual Activity  . Alcohol use: No  . Drug use: No  . Sexual activity: Not Currently  Lifestyle  . Physical activity    Days per week: 7 days    Minutes per session: 40 min  . Stress: Not at all  Relationships  . Social connections    Talks on phone: More than three times a  week    Gets together: Three times a week    Attends religious service: Patient refused    Active member of club or organization: Patient refused    Attends meetings of clubs or organizations: Patient refused    Relationship status: Married  . Intimate partner violence    Fear of current or ex partner: No    Emotionally abused: No    Physically abused: No    Forced sexual activity: No  Other Topics Concern  . Not on file  Social History Narrative   Employment: retired.    Outpatient Medications Prior to Visit  Medication Sig Dispense Refill  . albuterol (PROVENTIL HFA;VENTOLIN HFA) 108 (90 Base) MCG/ACT inhaler Inhale 2 puffs into the lungs every 4 (four) hours as needed for wheezing or shortness of breath (cough, shortness of breath or wheezing.). 1 Inhaler 1  . benzonatate (TESSALON) 200 MG capsule Take 1 capsule (200 mg total) by mouth 3 (three) times daily as needed for cough. 20 capsule 0  . chlorpheniramine-HYDROcodone (TUSSIONEX PENNKINETIC ER) 10-8 MG/5ML SUER Take 5 mLs by mouth at bedtime as needed for cough. 60 mL 0  . losartan-hydrochlorothiazide (HYZAAR) 100-25  MG tablet Take 1 tablet by mouth daily. 90 tablet 3  . meclizine (ANTIVERT) 25 MG tablet Take 1 tablet (25 mg total) 3 (three) times daily as needed by mouth for dizziness. 30 tablet 0  . ondansetron (ZOFRAN ODT) 4 MG disintegrating tablet Take 1-2 tablets (4-8 mg total) every 8 (eight) hours as needed by mouth for nausea or vomiting. 30 tablet 0   No facility-administered medications prior to visit.     No Known Allergies  ROS Review of Systems  Constitutional: Negative.   HENT: Negative.   Eyes: Negative.   Respiratory: Negative.   Cardiovascular: Negative.   Gastrointestinal: Negative.   Endocrine: Negative.   Genitourinary: Negative.   Musculoskeletal: Negative.   Skin: Negative.   Allergic/Immunologic: Negative.   Neurological: Negative.   Hematological: Negative.   Psychiatric/Behavioral:  Negative.       Objective:    Physical Exam  Constitutional: She appears well-developed and well-nourished. No distress.  HENT:  Head: Normocephalic and atraumatic.  Cardiovascular: Normal rate.  Pulmonary/Chest: Effort normal. No respiratory distress.  Skin: Skin is warm and dry. No rash noted. She is not diaphoretic. No erythema. No pallor.  Psychiatric: She has a normal mood and affect. Her behavior is normal. Judgment and thought content normal.  Nursing note and vitals reviewed.   BP (!) 150/82   Pulse 85   Temp 98.4 F (36.9 C)   Resp 18   Wt 223 lb (101.2 kg)   SpO2 97%   BMI 37.11 kg/m  Wt Readings from Last 3 Encounters:  04/16/19 223 lb (101.2 kg)  08/30/17 217 lb (98.4 kg)  02/16/17 216 lb 6.4 oz (98.2 kg)     Health Maintenance Due  Topic Date Due  . HIV Screening  06/21/1970  . TETANUS/TDAP  06/21/1974  . PAP SMEAR-Modifier  06/21/1976  . MAMMOGRAM  06/21/2005  . COLONOSCOPY  06/21/2005    There are no preventive care reminders to display for this patient.  No results found for: TSH Lab Results  Component Value Date   WBC 6.7 08/30/2017   HGB 14.4 08/30/2017   HCT 43.1 08/30/2017   MCV 89 08/30/2017   PLT 242 08/30/2017   Lab Results  Component Value Date   NA 140 08/30/2017   K 3.5 08/30/2017   CO2 29 08/30/2017   GLUCOSE 86 08/30/2017   BUN 16 08/30/2017   CREATININE 1.01 (H) 08/30/2017   BILITOT 0.4 08/30/2017   ALKPHOS 163 (H) 08/30/2017   AST 19 08/30/2017   ALT 23 08/30/2017   PROT 7.7 08/30/2017   ALBUMIN 4.5 08/30/2017   CALCIUM 9.7 08/30/2017   Lab Results  Component Value Date   CHOL 219 (H) 07/26/2016   Lab Results  Component Value Date   HDL 81 07/26/2016   Lab Results  Component Value Date   LDLCALC 122 07/26/2016   Lab Results  Component Value Date   TRIG 79 07/26/2016   Lab Results  Component Value Date   CHOLHDL 2.7 07/26/2016   Lab Results  Component Value Date   HGBA1C 5.4 07/26/2016       Assessment & Plan:   Problem List Items Addressed This Visit      Cardiovascular and Mediastinum   Essential hypertension, benign   Relevant Medications   losartan-hydrochlorothiazide (HYZAAR) 100-25 MG tablet    Other Visit Diagnoses    Encounter to establish care    -  Primary   Screening for endocrine, metabolic and immunity disorder  Relevant Orders   CBC with Differential/Platelet   Comprehensive metabolic panel   Hemoglobin A1c   TSH   Lipid screening       Relevant Orders   Lipid panel      Meds ordered this encounter  Medications  . losartan-hydrochlorothiazide (HYZAAR) 100-25 MG tablet    Sig: Take 1 tablet by mouth daily.    Dispense:  90 tablet    Refill:  0    Order Specific Question:   Supervising Provider    Answer:   Doristine BosworthSTALLINGS, ZOE A [id number]]    Follow-up: Return in 2 weeks (on 04/30/2019), or if symptoms worsen or fail to improve, for BP check nurse visit.   PLAN:  Medication refills sent. HYZAAR 100-25mg  PO qd 90 day supply given for HTN.   Pt to return to clinic in 2 weeks for BP check. If wnl, refill will be extended to 1 year supply. Otherwise, will check again in 3 mos and consider additional agent.  Labs drawn today, will follow up as warranted.  Patient encouraged to call clinic with any questions, comments, or concerns.   Janeece Ageeichard Demecia Northway, NP

## 2019-04-16 NOTE — Patient Instructions (Addendum)
   If you have lab work done today you will be contacted with your lab results within the next 2 weeks.  If you have not heard from us then please contact us. The fastest way to get your results is to register for My Chart.   IF you received an x-ray today, you will receive an invoice from [facility]. Please contact [facility] at [phone] with questions or concerns regarding your invoice.   IF you received labwork today, you will receive an invoice from [facility]. Please contact [facility] at [phone] with questions or concerns regarding your invoice.   Our billing staff will not be able to assist you with questions regarding bills from these companies.  You will be contacted with the lab results as soon as they are available. The fastest way to get your results is to activate your My Chart account. Instructions are located on the last page of this paperwork. If you have not heard from us regarding the results in 2 weeks, please contact this office.      Health Maintenance, Female Adopting a healthy lifestyle and getting preventive care are important in promoting health and wellness. Ask your health care provider about:  The right schedule for you to have regular tests and exams.  Things you can do on your own to prevent diseases and keep yourself healthy. What should I know about diet, weight, and exercise? Eat a healthy diet   Eat a diet that includes plenty of vegetables, fruits, low-fat dairy products, and lean protein.  Do not eat a lot of foods that are high in solid fats, added sugars, or sodium. Maintain a healthy weight Body mass index (BMI) is used to identify weight problems. It estimates body fat based on height and weight. Your health care provider can help determine your BMI and help you achieve or maintain a healthy weight. Get regular exercise Get regular exercise. This is one of the most important things you can do for your health. Most  adults should:  Exercise for at least 150 minutes each week. The exercise should increase your heart rate and make you sweat (moderate-intensity exercise).  Do strengthening exercises at least twice a week. This is in addition to the moderate-intensity exercise.  Spend less time sitting. Even light physical activity can be beneficial. Watch cholesterol and blood lipids Have your blood tested for lipids and cholesterol at 64 years of age, then have this test every 5 years. Have your cholesterol levels checked more often if:  Your lipid or cholesterol levels are high.  You are older than 64 years of age.  You are at high risk for heart disease. What should I know about cancer screening? Depending on your health history and family history, you may need to have cancer screening at various ages. This may include screening for:  Breast cancer.  Cervical cancer.  Colorectal cancer.  Skin cancer.  Lung cancer. What should I know about heart disease, diabetes, and high blood pressure? Blood pressure and heart disease  High blood pressure causes heart disease and increases the risk of stroke. This is more likely to develop in people who have high blood pressure readings, are of African descent, or are overweight.  Have your blood pressure checked: ? Every 3-5 years if you are 18-39 years of age. ? Every year if you are 40 years old or older. Diabetes Have regular diabetes screenings. This checks your fasting blood sugar level. Have the screening done:  Once every   three years after age 40 if you are at a normal weight and have a low risk for diabetes.  More often and at a younger age if you are overweight or have a high risk for diabetes. What should I know about preventing infection? Hepatitis B If you have a higher risk for hepatitis B, you should be screened for this virus. Talk with your health care provider to find out if you are at risk for hepatitis B infection. Hepatitis  C Testing is recommended for:  Everyone born from 1945 through 1965.  Anyone with known risk factors for hepatitis C. Sexually transmitted infections (STIs)  Get screened for STIs, including gonorrhea and chlamydia, if: ? You are sexually active and are younger than 64 years of age. ? You are older than 64 years of age and your health care provider tells you that you are at risk for this type of infection. ? Your sexual activity has changed since you were last screened, and you are at increased risk for chlamydia or gonorrhea. Ask your health care provider if you are at risk.  Ask your health care provider about whether you are at high risk for HIV. Your health care provider may recommend a prescription medicine to help prevent HIV infection. If you choose to take medicine to prevent HIV, you should first get tested for HIV. You should then be tested every 3 months for as long as you are taking the medicine. Pregnancy  If you are about to stop having your period (premenopausal) and you may become pregnant, seek counseling before you get pregnant.  Take 400 to 800 micrograms (mcg) of folic acid every day if you become pregnant.  Ask for birth control (contraception) if you want to prevent pregnancy. Osteoporosis and menopause Osteoporosis is a disease in which the bones lose minerals and strength with aging. This can result in bone fractures. If you are 65 years old or older, or if you are at risk for osteoporosis and fractures, ask your health care provider if you should:  Be screened for bone loss.  Take a calcium or vitamin D supplement to lower your risk of fractures.  Be given hormone replacement therapy (HRT) to treat symptoms of menopause. Follow these instructions at home: Lifestyle  Do not use any products that contain nicotine or tobacco, such as cigarettes, e-cigarettes, and chewing tobacco. If you need help quitting, ask your health care provider.  Do not use street  drugs.  Do not share needles.  Ask your health care provider for help if you need support or information about quitting drugs. Alcohol use  Do not drink alcohol if: ? Your health care provider tells you not to drink. ? You are pregnant, may be pregnant, or are planning to become pregnant.  If you drink alcohol: ? Limit how much you use to 0-1 drink a day. ? Limit intake if you are breastfeeding.  Be aware of how much alcohol is in your drink. In the [place] one drink equals one 12 oz bottle of beer (355 mL), one 5 oz glass of wine (148 mL), or one 1 oz glass of hard liquor (44 mL). General instructions  Schedule regular health, dental, and eye exams.  Stay current with your vaccines.  Tell your health care provider if: ? You often feel depressed. ? You have ever been abused or do not feel safe at home. Summary  Adopting a healthy lifestyle and getting preventive care are important in promoting health and wellness.    Follow your health care provider's instructions about healthy diet, exercising, and getting tested or screened for diseases.  Follow your health care provider's instructions on monitoring your cholesterol and blood pressure. This information is not intended to replace advice given to you by your health care provider. Make sure you discuss any questions you have with your health care provider. Document Released: 04/18/2011 Document Revised: 09/26/2018 Document Reviewed: 09/26/2018 Elsevier Patient Education  2020 [employer].   Colonoscopy, Adult A colonoscopy is an exam to look at the entire large intestine. During the exam, a lubricated, flexible tube that has a camera on the end of it is inserted into the anus and then passed into the rectum, colon, and other parts of the large intestine. You may have a colonoscopy as a part of normal colorectal screening or if you have certain symptoms, such as:  Lack of red blood cells (anemia).  Diarrhea that does not go  away.  Abdominal pain.  Blood in your stool (feces). A colonoscopy can help screen for and diagnose medical problems, including:  Tumors.  Polyps.  Inflammation.  Areas of bleeding. Tell a health care provider about:  Any allergies you have.  All medicines you are taking, including vitamins, herbs, eye drops, creams, and over-the-counter medicines.  Any problems you or family members have had with anesthetic medicines.  Any blood disorders you have.  Any surgeries you have had.  Any medical conditions you have.  Any problems you have had passing stool. What are the risks? Generally, this is a safe procedure. However, problems may occur, including:  Bleeding.  A tear in the intestine.  A reaction to medicines given during the exam.  Infection (rare). What happens before the procedure? Eating and drinking restrictions Follow instructions from your health care provider about eating and drinking, which may include:  A few days before the procedure - follow a low-fiber diet. Avoid nuts, seeds, dried fruit, raw fruits, and vegetables.  1-3 days before the procedure - follow a clear liquid diet. Drink only clear liquids, such as clear broth or bouillon, black coffee or tea, clear juice, clear soft drinks or sports drinks, gelatin dessert, and popsicles. Avoid any liquids that contain red or purple dye.  On the day of the procedure - do not eat or drink anything starting 2 hours before the procedure, or within the time period that your health care provider recommends. Up to 2 hours before the procedure, you may continue to drink clear liquids, such as water or clear fruit juice. Bowel prep If you were prescribed an oral bowel prep to clean out your colon:  Take it as told by your health care provider. Starting the day before your procedure, you will need to drink a large amount of medicated liquid. The liquid will cause you to have multiple loose stools until your stool is  almost clear or light green.  If your skin or anus gets irritated from diarrhea, you may use these to relieve the irritation: ? Medicated wipes, such as adult wet wipes with aloe and vitamin E. ? A skin-soothing product like petroleum jelly.  If you vomit while drinking the bowel prep, take a break for up to 60 minutes and then begin the bowel prep again. If vomiting continues and you cannot take the bowel prep without vomiting, call your health care provider.  To clean out your colon, you may also be given: ? Laxative medicines. ? Instructions about how to use an enema. General instructions  Ask your health care provider about: ? Changing or stopping your regular medicines or supplements. This is especially important if you are taking iron supplements, diabetes medicines, or blood thinners. ? Taking medicines such as aspirin and ibuprofen. These medicines can thin your blood. Do not take these medicines before the procedure if your health care provider tells you not to.  Plan to have someone take you home from the hospital or clinic. What happens during the procedure?   An IV may be inserted into one of your veins.  You will be given medicine to help you relax (sedative).  To reduce your risk of infection: ? Your health care team will wash or sanitize their hands. ? Your anal area will be washed with soap.  You will be asked to lie on your side with your knees bent.  Your health care provider will lubricate a long, thin, flexible tube. The tube will have a camera and a light on the end.  The tube will be inserted into your anus.  The tube will be gently eased through your rectum and colon.  Air will be delivered into your colon to keep it open. You may feel some pressure or cramping.  The camera will be used to take images during the procedure.  A small tissue sample may be removed to be examined under a microscope (biopsy).  If small polyps are found, your health care  provider may remove them and have them checked for cancer cells.  When the exam is done, the tube will be removed. The procedure may vary among health care providers and hospitals. What happens after the procedure?  Your blood pressure, heart rate, breathing rate, and blood oxygen level will be monitored until the medicines you were given have worn off.  Do not drive for 24 hours after the exam.  You may have a small amount of blood in your stool.  You may pass gas and have mild abdominal cramping or bloating due to the air that was used to inflate your colon during the exam.  It is up to you to get the results of your procedure. Ask your health care provider, or the department performing the procedure, when your results will be ready. Summary  A colonoscopy is an exam to look at the entire large intestine.  During a colonoscopy, a lubricated, flexible tube with a camera on the end of it is inserted into the anus and then passed into the colon and other parts of the large intestine.  Follow instructions from your health care provider about eating and drinking before the procedure.  If you were prescribed an oral bowel prep to clean out your colon, take it as told by your health care provider.  After your procedure, your blood pressure, heart rate, breathing rate, and blood oxygen level will be monitored until the medicines you were given have worn off. This information is not intended to replace advice given to you by your health care provider. Make sure you discuss any questions you have with your health care provider. Document Released: 09/30/2000 Document Revised: 07/26/2017 Document Reviewed: 12/15/2015 Elsevier Patient Education  2020 [employer].     Why follow it? Research shows. . Those who follow the Mediterranean diet have a reduced risk of heart disease  . The diet is associated with a reduced incidence of Parkinson's and Alzheimer's diseases . People following the diet  may have longer life expectancies and lower rates of chronic diseases  . The Dietary  Guidelines for Americans recommends the Mediterranean diet as an eating plan to promote health and prevent disease  What Is the Mediterranean Diet?  . Healthy eating plan based on typical foods and recipes of Mediterranean-style cooking . The diet is primarily a plant based diet; these foods should make up a majority of meals   Starches - Plant based foods should make up a majority of meals - They are an important sources of vitamins, minerals, energy, antioxidants, and fiber - Choose whole grains, foods high in fiber and minimally processed items  - Typical grain sources include wheat, oats, barley, corn, brown rice, bulgar, farro, millet, polenta, couscous  - Various types of beans include chickpeas, lentils, fava beans, black beans, white beans   Fruits  Veggies - Large quantities of antioxidant rich fruits & veggies; 6 or more servings  - Vegetables can be eaten raw or lightly drizzled with oil and cooked  - Vegetables common to the traditional Mediterranean Diet include: artichokes, arugula, beets, broccoli, brussel sprouts, cabbage, carrots, celery, collard greens, cucumbers, eggplant, kale, leeks, lemons, lettuce, mushrooms, okra, onions, peas, peppers, potatoes, pumpkin, radishes, rutabaga, shallots, spinach, sweet potatoes, turnips, zucchini - Fruits common to the Mediterranean Diet include: apples, apricots, avocados, cherries, clementines, dates, figs, grapefruits, grapes, melons, nectarines, oranges, peaches, pears, pomegranates, strawberries, tangerines  Fats - Replace butter and margarine with healthy oils, such as olive oil, canola oil, and tahini  - Limit nuts to no more than a handful a day  - Nuts include walnuts, almonds, pecans, pistachios, pine nuts  - Limit or avoid candied, honey roasted or heavily salted nuts - Olives are central to the [employer] diet - can be eaten whole or used in a  variety of dishes   Meats Protein - Limiting red meat: no more than a few times a month - When eating red meat: choose lean cuts and keep the portion to the size of deck of cards - Eggs: approx. 0 to 4 times a week  - Fish and lean poultry: at least 2 a week  - Healthy protein sources include, chicken, [place], lean beef, lamb - Increase intake of seafood such as tuna, salmon, trout, mackerel, shrimp, scallops - Avoid or limit high fat processed meats such as sausage and bacon  Dairy - Include moderate amounts of low fat dairy products  - Focus on healthy dairy such as fat free yogurt, skim milk, low or reduced fat cheese - Limit dairy products higher in fat such as whole or 2% milk, cheese, ice cream  Alcohol - Moderate amounts of red wine is ok  - No more than 5 oz daily for women (all ages) and men older than age 64  - No more than 10 oz of wine daily for men younger than 6065  Other - Limit sweets and other desserts  - Use herbs and spices instead of salt to flavor foods  - Herbs and spices common to the traditional Mediterranean Diet include: basil, bay leaves, chives, cloves, cumin, fennel, garlic, lavender, marjoram, mint, oregano, parsley, pepper, rosemary, sage, savory, sumac, tarragon, thyme   It's not just a diet, it's a lifestyle:  . The Mediterranean diet includes lifestyle factors typical of those in the region  . Foods, drinks and meals are best eaten with others and savored . Daily physical activity is important for overall good health . This could be strenuous exercise like running and aerobics . This could also be more leisurely activities such as walking, housework, yard-work,  or taking the stairs . Moderation is the key; a balanced and healthy diet accommodates most foods and drinks . Consider portion sizes and frequency of consumption of certain foods   Meal Ideas & Options:  . Breakfast:  o Whole wheat toast or whole wheat English muffins with peanut butter & hard  boiled egg o Steel cut oats topped with apples & cinnamon and skim milk  o Fresh fruit: banana, strawberries, melon, berries, peaches  o Smoothies: strawberries, bananas, greek yogurt, peanut butter o Low fat greek yogurt with blueberries and granola  o Egg white omelet with spinach and mushrooms o Breakfast couscous: whole wheat couscous, apricots, skim milk, cranberries  . Sandwiches:  o Hummus and grilled vegetables (peppers, zucchini, squash) on whole wheat bread   o Grilled chicken on whole wheat pita with lettuce, tomatoes, cucumbers or tzatziki  o Tuna salad on whole wheat bread: tuna salad made with greek yogurt, olives, red peppers, capers, green onions o Garlic rosemary lamb pita: lamb sauted with garlic, rosemary, salt & pepper; add lettuce, cucumber, greek yogurt to pita - flavor with lemon juice and black pepper  . Seafood:  o Mediterranean grilled salmon, seasoned with garlic, basil, parsley, lemon juice and black pepper o Shrimp, lemon, and spinach whole-grain pasta salad made with low fat greek yogurt  o Seared scallops with lemon orzo  o Seared tuna steaks seasoned salt, pepper, coriander topped with tomato mixture of olives, tomatoes, olive oil, minced garlic, parsley, green onions and cappers  . Meats:  o Herbed greek chicken salad with kalamata olives, cucumber, feta  o Red bell peppers stuffed with spinach, bulgur, lean ground beef (or lentils) & topped with feta   o Kebabs: skewers of chicken, tomatoes, onions, zucchini, squash  o [place] burgers: made with red onions, mint, dill, lemon juice, feta cheese topped with roasted red peppers . Vegetarian o Cucumber salad: cucumbers, artichoke hearts, celery, red onion, feta cheese, tossed in olive oil & lemon juice  o Hummus and whole grain pita points with a greek salad (lettuce, tomato, feta, olives, cucumbers, red onion) o Lentil soup with celery, carrots made with vegetable broth, garlic, salt and pepper  o Tabouli  salad: parsley, bulgur, mint, scallions, cucumbers, tomato, radishes, lemon juice, olive oil, salt and pepper.       Fat and Cholesterol Restricted Eating Plan Eating a diet that limits fat and cholesterol may help lower your risk for heart disease and other conditions. Your body needs fat and cholesterol for basic functions, but eating too much of these things can be harmful to your health. Your health care provider may order lab tests to check your blood fat (lipid) and cholesterol levels. This helps your health care provider understand your risk for certain conditions and whether you need to make diet changes. Work with your health care provider or dietitian to make an eating plan that is right for you. Your plan includes:  Limit your fat intake to ______% or less of your total calories a day.  Limit your saturated fat intake to ______% or less of your total calories a day.  Limit the amount of cholesterol in your diet to less than _________mg a day.  Eat ___________ g of fiber a day. What are tips for following this plan? General guidelines   If you are overweight, work with your health care provider to lose weight safely. Losing just 5-10% of your body weight can improve your overall health and help prevent diseases such as  diabetes and heart disease.  Avoid: ? Foods with added sugar. ? Fried foods. ? Foods that contain partially hydrogenated oils, including stick margarine, some tub margarines, cookies, crackers, and other baked goods.  Limit alcohol intake to no more than 1 drink a day for nonpregnant women and 2 drinks a day for men. One drink equals 12 oz of beer, 5 oz of wine, or 1 oz of hard liquor. Reading food labels  Check food labels for: ? Trans fats, partially hydrogenated oils, or high amounts of saturated fat. Avoid foods that contain saturated fat and trans fat. ? The amount of cholesterol in each serving. Try to eat no more than 200 mg of cholesterol each  day. ? The amount of fiber in each serving. Try to eat at least 20-30 g of fiber each day.  Choose foods with healthy fats, such as: ? Monounsaturated and polyunsaturated fats. These include olive and canola oil, flaxseeds, walnuts, almonds, and seeds. ? Omega-3 fats. These are found in foods such as salmon, mackerel, sardines, tuna, flaxseed oil, and ground flaxseeds.  Choose grain products that have whole grains. Look for the word "whole" as the first word in the ingredient list. Cooking  Cook foods using methods other than frying. Baking, boiling, grilling, and broiling are some healthy options.  Eat more home-cooked food and less restaurant, buffet, and fast food.  Avoid cooking using saturated fats. ? Animal sources of saturated fats include meats, butter, and cream. ? Plant sources of saturated fats include palm oil, palm kernel oil, and coconut oil. Meal planning   At meals, imagine dividing your plate into fourths: ? Fill one-half of your plate with vegetables and green salads. ? Fill one-fourth of your plate with whole grains. ? Fill one-fourth of your plate with lean protein foods.  Eat fish that is high in omega-3 fats at least two times a week.  Eat more foods that contain fiber, such as whole grains, beans, apples, broccoli, carrots, peas, and barley. These foods help promote healthy cholesterol levels in the blood. Recommended foods Grains  Whole grains, such as whole wheat or whole grain breads, crackers, cereals, and pasta. Unsweetened oatmeal, bulgur, barley, quinoa, or brown rice. Corn or whole wheat flour tortillas. Vegetables  Fresh or frozen vegetables (raw, steamed, roasted, or grilled). Green salads. Fruits  All fresh, canned (in natural juice), or frozen fruits. Meats and other protein foods  Ground beef (85% or leaner), grass-fed beef, or beef trimmed of fat. Skinless chicken or [place]. Ground chicken or [place]. Pork trimmed of fat. All fish and  seafood. Egg whites. Dried beans, peas, or lentils. Unsalted nuts or seeds. Unsalted canned beans. Natural nut butters without added sugar and oil. Dairy  Low-fat or nonfat dairy products, such as skim or 1% milk, 2% or reduced-fat cheeses, low-fat and fat-free ricotta or cottage cheese, or plain low-fat and nonfat yogurt. Fats and oils  Tub margarine without trans fats. Light or reduced-fat mayonnaise and salad dressings. Avocado. Olive, canola, sesame, or safflower oils. The items listed above may not be a complete list of recommended foods or beverages. Contact your dietitian for more options. Foods to avoid Grains  White bread. White pasta. White rice. Cornbread. Bagels, pastries, and croissants. Crackers and snack foods that contain trans fat and hydrogenated oils. Vegetables  Vegetables cooked in cheese, cream, or butter sauce. Fried vegetables. Fruits  Canned fruit in heavy syrup. Fruit in cream or butter sauce. Fried fruit. Meats and other protein foods  Fatty  cuts of meat. Ribs, chicken wings, bacon, sausage, bologna, salami, chitterlings, fatback, hot dogs, bratwurst, and packaged lunch meats. Liver and organ meats. Whole eggs and egg yolks. Chicken and [place] with skin. Fried meat. Dairy  Whole or 2% milk, cream, half-and-half, and cream cheese. Whole milk cheeses. Whole-fat or sweetened yogurt. Full-fat cheeses. Nondairy creamers and whipped toppings. Processed cheese, cheese spreads, and cheese curds. Beverages  Alcohol. Sugar-sweetened drinks such as sodas, lemonade, and fruit drinks. Fats and oils  Butter, stick margarine, lard, shortening, ghee, or bacon fat. Coconut, palm kernel, and palm oils. Sweets and desserts  Corn syrup, sugars, honey, and molasses. Candy. Jam and jelly. Syrup. Sweetened cereals. Cookies, pies, cakes, donuts, muffins, and ice cream. The items listed above may not be a complete list of foods and beverages to avoid. Contact your dietitian for  more information. Summary  Your body needs fat and cholesterol for basic functions. However, eating too much of these things can be harmful to your health.  Work with your health care provider and dietitian to follow a diet low in fat and cholesterol. Doing this may help lower your risk for heart disease and other conditions.  Choose healthy fats, such as monounsaturated and polyunsaturated fats, and foods high in omega-3 fatty acids.  Eat fiber-rich foods, such as whole grains, beans, peas, fruits, and vegetables.  Limit or avoid alcohol, fried foods, and foods high in saturated fats, partially hydrogenated oils, and sugar. This information is not intended to replace advice given to you by your health care provider. Make sure you discuss any questions you have with your health care provider. Document Released: 10/03/2005 Document Revised: 09/15/2017 Document Reviewed: 06/20/2017 Elsevier Patient Education  2020 [employer]  American Heart Association (AHA) Exercise Recommendation  Being physically active is important to prevent heart disease and stroke, the nation's No. 1and No. 5killers. To improve overall cardiovascular health, we suggest at least 150 minutes per week of moderate exercise or 75 minutes per week of vigorous exercise (or a combination of moderate and vigorous activity). Thirty minutes a day, five times a week is an easy goal to remember. You will also experience benefits even if you divide your time into two or three segments of 10 to 15 minutes per day.  For people who would benefit from lowering their blood pressure or cholesterol, we recommend 40 minutes of aerobic exercise of moderate to vigorous intensity three to four times a week to lower the risk for heart attack and stroke.  Physical activity is anything that makes you move your body and burn calories.  This includes things like climbing stairs or playing sports. Aerobic exercises benefit your heart, and include  walking, jogging, swimming or biking. Strength and stretching exercises are best for overall stamina and flexibility.  The simplest, positive change you can make to effectively improve your heart health is to start walking. It's enjoyable, free, easy, social and great exercise. A walking program is flexible and boasts high success rates because people can stick with it. It's easy for walking to become a regular and satisfying part of life.   For Overall Cardiovascular Health:  At least 30 minutes of moderate-intensity aerobic activity at least 5 days per week for a total of 150  OR   At least 25 minutes of vigorous aerobic activity at least 3 days per week for a total of 75 minutes; or a combination of moderate- and vigorous-intensity aerobic activity  AND   Moderate- to high-intensity muscle-strengthening activity at  least 2 days per week for additional health benefits.  For Lowering Blood Pressure and Cholesterol  An average 40 minutes of moderate- to vigorous-intensity aerobic activity 3 or 4 times per week  What if I can't make it to the time goal? Something is always better than nothing! And everyone has to start somewhere. Even if you've been sedentary for years, today is the day you can begin to make healthy changes in your life. If you don't think you'll make it for 30 or 40 minutes, set a reachable goal for today. You can work up toward your overall goal by increasing your time as you get stronger. Don't let all-or-nothing thinking rob you of doing what you can every day.  Source:http://www.heart.org

## 2019-04-17 LAB — LIPID PANEL
Chol/HDL Ratio: 3 ratio (ref 0.0–4.4)
Cholesterol, Total: 244 mg/dL — ABNORMAL HIGH (ref 100–199)
HDL: 82 mg/dL (ref >39)
LDL Calculated: 152 mg/dL — ABNORMAL HIGH (ref 0–99)
Triglycerides: 51 mg/dL (ref 0–149)
VLDL Cholesterol Cal: 10 mg/dL (ref 5–40)

## 2019-04-17 LAB — CBC WITH DIFFERENTIAL/PLATELET
Basophils Absolute: 0 10*3/uL (ref 0.0–0.2)
Basos: 1 %
EOS (ABSOLUTE): 0.1 10*3/uL (ref 0.0–0.4)
Eos: 1 %
Hematocrit: 42.5 % (ref 34.0–46.6)
Hemoglobin: 13.9 g/dL (ref 11.1–15.9)
Immature Grans (Abs): 0 10*3/uL (ref 0.0–0.1)
Immature Granulocytes: 0 %
Lymphocytes Absolute: 2.3 10*3/uL (ref 0.7–3.1)
Lymphs: 40 %
MCH: 29.5 pg (ref 26.6–33.0)
MCHC: 32.7 g/dL (ref 31.5–35.7)
MCV: 90 fL (ref 79–97)
Monocytes Absolute: 0.3 10*3/uL (ref 0.1–0.9)
Monocytes: 6 %
Neutrophils Absolute: 2.9 10*3/uL (ref 1.4–7.0)
Neutrophils: 52 %
Platelets: 231 10*3/uL (ref 150–450)
RBC: 4.71 x10E6/uL (ref 3.77–5.28)
RDW: 13.4 % (ref 11.7–15.4)
WBC: 5.6 10*3/uL (ref 3.4–10.8)

## 2019-04-17 LAB — COMPREHENSIVE METABOLIC PANEL
ALT: 35 IU/L — ABNORMAL HIGH (ref 0–32)
AST: 26 IU/L (ref 0–40)
Albumin/Globulin Ratio: 1.6 (ref 1.2–2.2)
Albumin: 4.7 g/dL (ref 3.8–4.8)
Alkaline Phosphatase: 155 IU/L — ABNORMAL HIGH (ref 39–117)
BUN/Creatinine Ratio: 21 (ref 12–28)
BUN: 18 mg/dL (ref 8–27)
Bilirubin Total: 0.4 mg/dL (ref 0.0–1.2)
CO2: 23 mmol/L (ref 20–29)
Calcium: 9.7 mg/dL (ref 8.7–10.3)
Chloride: 103 mmol/L (ref 96–106)
Creatinine, Ser: 0.86 mg/dL (ref 0.57–1.00)
GFR calc Af Amer: 83 mL/min/{1.73_m2} (ref >59)
GFR calc non Af Amer: 72 mL/min/{1.73_m2} (ref >59)
Globulin, Total: 3 g/dL (ref 1.5–4.5)
Glucose: 100 mg/dL — ABNORMAL HIGH (ref 65–99)
Potassium: 3.9 mmol/L (ref 3.5–5.2)
Sodium: 141 mmol/L (ref 134–144)
Total Protein: 7.7 g/dL (ref 6.0–8.5)

## 2019-04-17 LAB — HEMOGLOBIN A1C
Est. average glucose Bld gHb Est-mCnc: 117 mg/dL
Hgb A1c MFr Bld: 5.7 % — ABNORMAL HIGH (ref 4.8–5.6)

## 2019-04-17 LAB — TSH: TSH: 1.77 u[IU]/mL (ref 0.450–4.500)

## 2019-05-02 ENCOUNTER — Other Ambulatory Visit: Payer: Self-pay

## 2019-05-02 ENCOUNTER — Encounter: Payer: Self-pay | Admitting: Family Medicine

## 2019-05-02 ENCOUNTER — Encounter: Payer: 59 | Admitting: Family Medicine

## 2019-05-02 ENCOUNTER — Telehealth: Payer: Self-pay

## 2019-05-02 VITALS — BP 158/90

## 2019-05-02 DIAGNOSIS — I10 Essential (primary) hypertension: Secondary | ICD-10-CM

## 2019-05-02 NOTE — Progress Notes (Signed)
Pt came in for bp check. I checked her bp, it was 164/94. I checked it a 2nd time, it was 158/90.

## 2019-05-02 NOTE — Telephone Encounter (Signed)
Pt came in for bp check. I checked her bp, it was 164/94. I checked it a 2nd time, it was 158/90. 

## 2019-05-03 ENCOUNTER — Other Ambulatory Visit: Payer: Self-pay | Admitting: Registered Nurse

## 2019-05-03 DIAGNOSIS — I10 Essential (primary) hypertension: Secondary | ICD-10-CM

## 2019-05-03 MED ORDER — AMLODIPINE BESYLATE 5 MG PO TABS: 5.0000 mg | ORAL_TABLET | Freq: Every day | ORAL | 3 refills | Status: DC

## 2019-05-03 NOTE — Progress Notes (Signed)
Called Meredith Randolph to discuss her two week BP check that remained high. Unfortunately it looks like she may have to start an additional agent. Amlodipine 5mg  PO qd ordered. Pt states she will not take this and hopes to address her HTN with diet and exercise. She will present in 3 mos for follow up.  Discussed risks of uncontrolled BP, patient understands.  Kathrin Ruddy, NP

## 2019-05-09 NOTE — Progress Notes (Signed)
This encounter was created in error - please disregard.

## 2019-07-02 ENCOUNTER — Other Ambulatory Visit: Payer: Self-pay | Admitting: Registered Nurse

## 2019-07-02 DIAGNOSIS — I10 Essential (primary) hypertension: Secondary | ICD-10-CM

## 2019-11-02 ENCOUNTER — Other Ambulatory Visit: Payer: Self-pay | Admitting: Registered Nurse

## 2019-11-02 DIAGNOSIS — I10 Essential (primary) hypertension: Secondary | ICD-10-CM

## 2020-03-06 ENCOUNTER — Other Ambulatory Visit: Payer: Self-pay | Admitting: Registered Nurse

## 2020-03-06 DIAGNOSIS — I10 Essential (primary) hypertension: Secondary | ICD-10-CM

## 2020-03-06 NOTE — Telephone Encounter (Signed)
No further refills without office visit 

## 2020-03-06 NOTE — Telephone Encounter (Signed)
Please schedule patient a 6 month f/u for any further refills 30 day supply has been sent into the pharmacy

## 2020-03-09 NOTE — Telephone Encounter (Signed)
03/09/2020 - PATIENT CAN NOT GET ANY MORE REFILLS WITH OUT MAKING A FOLLOW-UP APPOINTMENT WITH RICH MORROW. SHE WAS GIVEN A 30 DAY SUPPLY UNTIL SHE CAN COME INTO THE OFFICE. I TRIED TO CALL AND SCHEDULE BUT HER VOICE MAIL SAID THE CALL CAN NOT BE COMPLETED AT THIS TIME - HANG UP AND TRY AGAIN LATER. MBC

## 2020-04-02 ENCOUNTER — Other Ambulatory Visit: Payer: Self-pay | Admitting: Registered Nurse

## 2020-04-02 DIAGNOSIS — I10 Essential (primary) hypertension: Secondary | ICD-10-CM

## 2020-05-06 ENCOUNTER — Other Ambulatory Visit: Payer: Self-pay | Admitting: Registered Nurse

## 2020-05-06 DIAGNOSIS — I10 Essential (primary) hypertension: Secondary | ICD-10-CM

## 2020-08-12 ENCOUNTER — Encounter: Payer: Self-pay | Admitting: Registered Nurse

## 2022-01-01 ENCOUNTER — Other Ambulatory Visit: Payer: Self-pay

## 2022-01-01 ENCOUNTER — Observation Stay (HOSPITAL_COMMUNITY)
Admission: EM | Admit: 2022-01-01 | Discharge: 2022-01-02 | Disposition: A | Discharge status: Home / Self Care | Payer: Medicare Other | Attending: Internal Medicine | Admitting: Internal Medicine

## 2022-01-01 ENCOUNTER — Encounter (HOSPITAL_COMMUNITY): Payer: Self-pay

## 2022-01-01 ENCOUNTER — Emergency Department (HOSPITAL_COMMUNITY): Payer: Medicare Other

## 2022-01-01 DIAGNOSIS — E669 Obesity, unspecified: Secondary | ICD-10-CM | POA: Insufficient documentation

## 2022-01-01 DIAGNOSIS — Z87891 Personal history of nicotine dependence: Secondary | ICD-10-CM | POA: Insufficient documentation

## 2022-01-01 DIAGNOSIS — E7800 Pure hypercholesterolemia, unspecified: Secondary | ICD-10-CM | POA: Diagnosis not present

## 2022-01-01 DIAGNOSIS — Z6832 Body mass index (BMI) 32.0-32.9, adult: Secondary | ICD-10-CM | POA: Insufficient documentation

## 2022-01-01 DIAGNOSIS — Z20822 Contact with and (suspected) exposure to covid-19: Secondary | ICD-10-CM | POA: Insufficient documentation

## 2022-01-01 DIAGNOSIS — R200 Anesthesia of skin: Secondary | ICD-10-CM | POA: Diagnosis present

## 2022-01-01 DIAGNOSIS — I110 Hypertensive heart disease with heart failure: Secondary | ICD-10-CM | POA: Diagnosis not present

## 2022-01-01 DIAGNOSIS — I10 Essential (primary) hypertension: Secondary | ICD-10-CM | POA: Diagnosis not present

## 2022-01-01 DIAGNOSIS — R29898 Other symptoms and signs involving the musculoskeletal system: Secondary | ICD-10-CM | POA: Diagnosis present

## 2022-01-01 DIAGNOSIS — G459 Transient cerebral ischemic attack, unspecified: Principal | ICD-10-CM | POA: Insufficient documentation

## 2022-01-01 DIAGNOSIS — I5032 Chronic diastolic (congestive) heart failure: Secondary | ICD-10-CM | POA: Diagnosis not present

## 2022-01-01 DIAGNOSIS — R748 Abnormal levels of other serum enzymes: Secondary | ICD-10-CM | POA: Diagnosis not present

## 2022-01-01 DIAGNOSIS — R531 Weakness: Secondary | ICD-10-CM

## 2022-01-01 DIAGNOSIS — E785 Hyperlipidemia, unspecified: Secondary | ICD-10-CM | POA: Diagnosis present

## 2022-01-01 DIAGNOSIS — Z79899 Other long term (current) drug therapy: Secondary | ICD-10-CM | POA: Insufficient documentation

## 2022-01-01 LAB — RAPID URINE DRUG SCREEN, HOSP PERFORMED
Amphetamines: NOT DETECTED
Barbiturates: NOT DETECTED
Benzodiazepines: NOT DETECTED
Cocaine: NOT DETECTED
Opiates: NOT DETECTED
Tetrahydrocannabinol: NOT DETECTED

## 2022-01-01 LAB — COMPREHENSIVE METABOLIC PANEL
ALT: 30 U/L (ref 0–44)
AST: 28 U/L (ref 15–41)
Albumin: 3.8 g/dL (ref 3.5–5.0)
Alkaline Phosphatase: 137 U/L — ABNORMAL HIGH (ref 38–126)
Anion gap: 9 (ref 5–15)
BUN: 18 mg/dL (ref 8–23)
CO2: 27 mmol/L (ref 22–32)
Calcium: 9.4 mg/dL (ref 8.9–10.3)
Chloride: 104 mmol/L (ref 98–111)
Creatinine, Ser: 1.05 mg/dL — ABNORMAL HIGH (ref 0.44–1.00)
GFR, Estimated: 59 mL/min — ABNORMAL LOW (ref >60)
Glucose, Bld: 105 mg/dL — ABNORMAL HIGH (ref 70–99)
Potassium: 3.8 mmol/L (ref 3.5–5.1)
Sodium: 140 mmol/L (ref 135–145)
Total Bilirubin: 0.4 mg/dL (ref 0.3–1.2)
Total Protein: 7.3 g/dL (ref 6.5–8.1)

## 2022-01-01 LAB — URINALYSIS, ROUTINE W REFLEX MICROSCOPIC
Bacteria, UA: NONE SEEN
Bilirubin Urine: NEGATIVE
Glucose, UA: NEGATIVE mg/dL
Hgb urine dipstick: NEGATIVE
Ketones, ur: NEGATIVE mg/dL
Leukocytes,Ua: NEGATIVE
Nitrite: NEGATIVE
Protein, ur: 100 mg/dL — AB
Specific Gravity, Urine: 1.024 (ref 1.005–1.030)
pH: 5 (ref 5.0–8.0)

## 2022-01-01 LAB — CBC
HCT: 42.7 % (ref 36.0–46.0)
Hemoglobin: 13.7 g/dL (ref 12.0–15.0)
MCH: 29.3 pg (ref 26.0–34.0)
MCHC: 32.1 g/dL (ref 30.0–36.0)
MCV: 91.4 fL (ref 80.0–100.0)
Platelets: 226 10*3/uL (ref 150–400)
RBC: 4.67 MIL/uL (ref 3.87–5.11)
RDW: 14.1 % (ref 11.5–15.5)
WBC: 8 10*3/uL (ref 4.0–10.5)
nRBC: 0 % (ref 0.0–0.2)

## 2022-01-01 LAB — I-STAT CHEM 8, ED
BUN: 21 mg/dL (ref 8–23)
Calcium, Ion: 1.21 mmol/L (ref 1.15–1.40)
Chloride: 104 mmol/L (ref 98–111)
Creatinine, Ser: 1.1 mg/dL — ABNORMAL HIGH (ref 0.44–1.00)
Glucose, Bld: 93 mg/dL (ref 70–99)
HCT: 43 % (ref 36.0–46.0)
Hemoglobin: 14.6 g/dL (ref 12.0–15.0)
Potassium: 3.8 mmol/L (ref 3.5–5.1)
Sodium: 142 mmol/L (ref 135–145)
TCO2: 30 mmol/L (ref 22–32)

## 2022-01-01 LAB — DIFFERENTIAL
Abs Immature Granulocytes: 0.01 10*3/uL (ref 0.00–0.07)
Basophils Absolute: 0 10*3/uL (ref 0.0–0.1)
Basophils Relative: 1 %
Eosinophils Absolute: 0.1 10*3/uL (ref 0.0–0.5)
Eosinophils Relative: 1 %
Immature Granulocytes: 0 %
Lymphocytes Relative: 44 %
Lymphs Abs: 3.5 10*3/uL (ref 0.7–4.0)
Monocytes Absolute: 0.5 10*3/uL (ref 0.1–1.0)
Monocytes Relative: 6 %
Neutro Abs: 3.9 10*3/uL (ref 1.7–7.7)
Neutrophils Relative %: 48 %

## 2022-01-01 LAB — PROTIME-INR
INR: 1 (ref 0.8–1.2)
Prothrombin Time: 13.4 seconds (ref 11.4–15.2)

## 2022-01-01 LAB — APTT: aPTT: 32 seconds (ref 24–36)

## 2022-01-01 LAB — RESP PANEL BY RT-PCR (FLU A&B, COVID) ARPGX2
Influenza A by PCR: NEGATIVE
Influenza B by PCR: NEGATIVE
SARS Coronavirus 2 by RT PCR: NEGATIVE

## 2022-01-01 LAB — ETHANOL: Alcohol, Ethyl (B): <10 mg/dL (ref <10)

## 2022-01-01 MED ORDER — STROKE: EARLY STAGES OF RECOVERY BOOK: Freq: Once | Status: DC

## 2022-01-01 MED ORDER — ACETAMINOPHEN 325 MG PO TABS: 650.0000 mg | ORAL_TABLET | ORAL | Status: DC | PRN

## 2022-01-01 MED ORDER — ENOXAPARIN SODIUM 40 MG/0.4ML IJ SOSY
40.0000 mg | PREFILLED_SYRINGE | INTRAMUSCULAR | Status: DC
  Administered 2022-01-02: 40 mg via SUBCUTANEOUS
  Filled 2022-01-01: qty 0.4

## 2022-01-01 MED ORDER — LABETALOL HCL 5 MG/ML IV SOLN: 10.0000 mg | INTRAVENOUS | Status: DC | PRN

## 2022-01-01 MED ORDER — SENNOSIDES-DOCUSATE SODIUM 8.6-50 MG PO TABS: 1.0000 | ORAL_TABLET | Freq: Every evening | ORAL | Status: DC | PRN

## 2022-01-01 MED ORDER — ACETAMINOPHEN 160 MG/5ML PO SOLN: 650.0000 mg | ORAL | Status: DC | PRN

## 2022-01-01 MED ORDER — ACETAMINOPHEN 650 MG RE SUPP: 650.0000 mg | RECTAL | Status: DC | PRN

## 2022-01-01 MED ORDER — ASPIRIN EC 81 MG PO TBEC
81.0000 mg | DELAYED_RELEASE_TABLET | Freq: Every day | ORAL | Status: DC
  Administered 2022-01-02 (×2): 81 mg via ORAL
  Filled 2022-01-01 (×2): qty 1

## 2022-01-01 NOTE — ED Provider Notes (Signed)
?[facility] ?Provider Note ? ? ?CSN: [id number] ?Arrival date & time: 01/01/22  1909 ? ?  ? ?History ? ?Chief Complaint  ?Patient presents with  ? Numbness  ? ? ?Meredith Randolph is a 66 y.o. female with History of hyperlipidemia and hypertension, not anticoagulated who presents with concern for 1 week of intermittent tingling in the lips and new right leg numbness and weakness which she first noticed at 3:00 this morning when she got up to use the bathroom. ?She states she always fell because of this and that her right hand intermittently also went numb.  She states this has been intermittent throughout the day and prompted her to come to the emergency department.  ? ? Review of patient's physical exam by ED triage provider on intake revealed documentation of right-sided facial droop and mild right leg weakness compared to the left, normal speech.  Patient is outside the 24-hour window. ? ?HPI ? ?  ? ?Home Medications ?Prior to Admission medications   ?Medication Sig Start Date End Date Taking? Authorizing Provider  ?albuterol (PROVENTIL HFA;VENTOLIN HFA) 108 (90 Base) MCG/ACT inhaler Inhale 2 puffs into the lungs every 4 (four) hours as needed for wheezing or shortness of breath (cough, shortness of breath or wheezing.). 02/16/17   Porfirio OarJeffery, Chelle, PA  ?amLODipine (NORVASC) 5 MG tablet Take 1 tablet (5 mg total) by mouth daily. 05/03/19   Janeece AgeeMorrow, Richard, NP  ?losartan-hydrochlorothiazide [facility](HYZAAR) 100-25 MG tablet TAKE 1 TABLET BY MOUTH EVERY DAY 04/02/20   Janeece AgeeMorrow, Richard, NP  ?   ? ?Allergies    ?Patient has no known allergies.   ? ?Review of Systems   ?Review of Systems ? ?Physical Exam ?Updated Vital Signs ?BP (!) 180/104 (BP Location: Right Arm)   Pulse 86   Temp 98.4 ?F (36.9 ?C) (Oral)   Resp 18   Ht 5\' 8"  (1.727 m)   Wt 95.3 kg   SpO2 96%   BMI 31.93 kg/m?  ?Physical Exam ?Vitals and nursing note reviewed.  ?Constitutional:   ?   Appearance: She is obese. She is  not ill-appearing or toxic-appearing.  ?HENT:  ?   Head: Normocephalic and atraumatic.  ?   Mouth/Throat:  ?   Mouth: Mucous membranes are moist.  ?   Pharynx: No oropharyngeal exudate or posterior oropharyngeal erythema.  ?Eyes:  ?   General: No visual field deficit.    ?   Right eye: No discharge.     ?   Left eye: No discharge.  ?   Extraocular Movements: Extraocular movements intact.  ?   Conjunctiva/sclera: Conjunctivae normal.  ?   Pupils: Pupils are equal, round, and reactive to light.  ?Cardiovascular:  ?   Rate and Rhythm: Normal rate and regular rhythm.  ?   Pulses: Normal pulses.  ?   Heart sounds: Normal heart sounds. No murmur heard. ?Pulmonary:  ?   Effort: Pulmonary effort is normal. No respiratory distress.  ?   Breath sounds: Normal breath sounds. No wheezing or rales.  ?Abdominal:  ?   General: Bowel sounds are normal. There is no distension.  ?   Palpations: Abdomen is soft.  ?   Tenderness: There is no abdominal tenderness. There is no right CVA tenderness, left CVA tenderness, guarding or rebound.  ?Musculoskeletal:     ?   General: No deformity.  ?   Cervical back: Neck supple.  ?   Right lower leg: No edema.  ?  Left lower leg: No edema.  ?Skin: ?   General: Skin is warm and dry.  ?   Capillary Refill: Capillary refill takes less than 2 seconds.  ?Neurological:  ?   General: No focal deficit present.  ?   Mental Status: She is alert and oriented to person, place, and time. Mental status is at baseline.  ?   GCS: GCS eye subscore is 4. GCS verbal subscore is 5. GCS motor subscore is 6.  ?   Cranial Nerves: Facial asymmetry present. No cranial nerve deficit or dysarthria.  ?   Sensory: Sensation is intact.  ?   Motor: Weakness present. No tremor, atrophy, abnormal muscle tone, seizure activity or pronator drift.  ?   Coordination: Coordination is intact.  ?   Gait: Gait is intact.  ?   Comments: Very subtle right sided facial droop. No pronator drift on my exam, though right pronator drift  documented by triage provider at time of intake.  ?4/5 strength in grip on the right, compared to 5/5 on left, 4/5 strength in hip flexion on right, 5/5 on left. Symmetric sensation in bilateral upper and lower extremities. Symmetric strength in plantar and dorsiflexion.   ?Psychiatric:     ?   Mood and Affect: Mood normal.  ? ? ?ED Results / Procedures / Treatments   ?Labs ?(all labs ordered are listed, but only abnormal results are displayed) ?Labs Reviewed  ?COMPREHENSIVE METABOLIC PANEL - Abnormal; Notable for the following components:  ?    Result Value  ? Glucose, Bld 105 (*)   ? Creatinine, Ser 1.05 (*)   ? Alkaline Phosphatase 137 (*)   ? GFR, Estimated 59 (*)   ? All other components within normal limits  ?URINALYSIS, ROUTINE W REFLEX MICROSCOPIC - Abnormal; Notable for the following components:  ? Color, Urine AMBER (*)   ? APPearance HAZY (*)   ? Protein, ur 100 (*)   ? All other components within normal limits  ?RESP PANEL BY RT-PCR (FLU A&B, COVID) ARPGX2  ?ETHANOL  ?PROTIME-INR  ?APTT  ?CBC  ?DIFFERENTIAL  ?RAPID URINE DRUG SCREEN, HOSP PERFORMED  ?I-STAT CHEM 8, ED  ? ? ?EKG ?None ? ?Radiology ?CT HEAD WO CONTRAST ? ?Result Date: 01/01/2022 ?CLINICAL DATA:  Neuro deficit, acute, stroke suspected Right leg weakness.  Right facial tingling. EXAM: CT HEAD WITHOUT CONTRAST TECHNIQUE: Contiguous axial images were obtained from the base of the skull through the vertex without intravenous contrast. RADIATION DOSE REDUCTION: This exam was performed according to the departmental dose-optimization program which includes automated exposure control, adjustment of the mA and/or kV according to patient size and/or use of iterative reconstruction technique. COMPARISON:  Remote head CT 07/12/2006 FINDINGS: Brain: No intracranial hemorrhage, mass effect, or midline shift. Brain volume is normal for age. No hydrocephalus. The basilar cisterns are patent. Mild to moderate periventricular white matter hypodensity typical  of chronic small vessel ischemia. There is a punctate lacunar infarct in the anterior limb of the right internal capsule. No evidence of territorial infarct or acute ischemia. No extra-axial or intracranial fluid collection. Vascular: No hyperdense vessel or unexpected calcification. Skull: No fracture. 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 Sinuses/Orbits: Paranasal sinuses and mastoid air cells are clear. The visualized orbits are unremarkable. Other: None. IMPRESSION: 1. No acute intracranial abnormality. 2. Mild to moderate chronic small vessel ischemia. Punctate lacunar infarct  in the anterior limb of the right internal capsule. Electronically Signed   By: Narda Rutherford M.D.   On: 01/01/2022 21:07   ? ?Procedures ?Marland KitchenCritical Care ?Performed by: Paris Lore, PA-C ?Authorized by: Paris Lore, PA-C  ? ?Critical care provider statement:  ?  Critical care time (minutes):  45 ?  Critical care was time spent personally by me on the following activities:  Development of treatment plan with patient or surrogate, discussions with consultants, evaluation of patient's response to treatment, examination of patient, obtaining history from patient or surrogate, ordering and performing treatments and interventions, ordering and review of laboratory studies, ordering and review of radiographic studies, pulse oximetry and re-evaluation of patient's condition  ? ? ?Medications Ordered in ED ?Medications - No data to display ? ?ED Course/ Medical Decision Making/ A&P ?Clinical Course as of 01/01/22 2324  ?Sat Jan 01, 2022  ?2200 Consult to Dr. Keturah Barre, who is agreeable to evaluating this patient in the emergency department and recommends admitting patient for completion of TIA workup.  [RS]  ?2222 Consult to Dr. Allena Katz, hospitalist, who is agreeable to seeing the  patient admitting her to his service.  Appreciate his collaboration in the care of this patient. [RS]  ?  ?Clinical Course User Index ?[RS] Denis Carreon, Eugene Gavia, PA-C  ? ?                        ?Medical Decision Stacy Gardner

## 2022-01-01 NOTE — ED Notes (Addendum)
Pt reporting no numbness in leg  at this time but some tingling in lips  ?

## 2022-01-01 NOTE — Assessment & Plan Note (Addendum)
Presenting with acute RLE weakness.  Neurology recommending admission for further TIA/stroke work-up.  CT head negative for acute changes, punctate lacunar infarct in anterior limb of right internal capsule noted. ?-Neurology to follow ?-Obtain MRI brain ?-MRA head and neck ?-Echocardiogram ?-Start on aspirin 81 mg daily ?-Monitor on telemetry, continue neurochecks ?-Check A1c and lipid panel ?-PT/OT/SLP eval ?-Allow permissive hypertension for now ?

## 2022-01-01 NOTE — Assessment & Plan Note (Signed)
Check lipid panel, would likely benefit from statin. ?

## 2022-01-01 NOTE — ED Triage Notes (Signed)
Pt has been complaining of lips going numb over about a week, intermittent. This am she almost fell due to rt leg went numb and her hand goes numb. Today this has also been intermittent.  ?

## 2022-01-01 NOTE — Hospital Course (Signed)
Meredith Randolph is a 65 y.o. female with medical history significant for hypertension, hyperlipidemia, former tobacco use who presented with acute right lower extremity weakness and is admitted for CVA work-up ?

## 2022-01-01 NOTE — Assessment & Plan Note (Addendum)
Patient with chronic mild alkaline phosphatase elevation.  Prior work-up in 2015 showed chronic CBD dilation and slightly inhomogenous liver appearance.  Bone isoenzymes were low-end macro hepatic isoenzymes were high.  Patient denies any abdominal symptoms. ?

## 2022-01-01 NOTE — ED Notes (Signed)
PA at bedside.

## 2022-01-01 NOTE — ED Provider Triage Note (Signed)
Emergency Medicine Provider Triage Evaluation Note ? ?Meredith Randolph , a 66 y.o. female  was evaluated in triage.  Pt complains of right leg weakness. This started at about 330 thisd morning and lasted about 30 minutes.   ? ?She went to bed at 830 pm last night which was the last time her leg was working normal.  She reports tingling in the right side ofher face.   ? ? ? ?Physical Exam  ?BP (!) 180/104 (BP Location: Right Arm)   Pulse 86   Temp 98.4 ?F (36.9 ?C) (Oral)   Resp 18   Ht 5\' 8"  (1.727 m)   Wt 95.3 kg   SpO2 96%   BMI 31.93 kg/m?  ?Gen:   Awake, no distress   ?Resp:  Normal effort  ?Neuro: Mild right sided facial droop and pronator drift.  Mild right leg weakness compared to left.  She is awake and alert with normal speech.   ? ?Medical Decision Making  ?Medically screening exam initiated at 8:23 PM.  Appropriate orders placed.  was informed that the remainder of the evaluation will be completed by another provider, this initial triage assessment does not replace that evaluation, and the importance of remaining in the ED until their evaluation is complete. ? ?Patient has right-sided face and arm weakness concerning for stroke, however she has had over 24 hours since her symptoms started and therefore outside any window.  She would not meet LVO criteria.  Orders are placed. ?  ?Meredith Eastern, PA-C ?01/01/22 2039 ? ?

## 2022-01-01 NOTE — Assessment & Plan Note (Signed)
Patient has not been taking antihypertensives since the beginning of the COVID-19 pandemic. ?-Allow permissive hypertension for now ?

## 2022-01-01 NOTE — H&P (Addendum)
?History and Physical  ? ? ?IQRA ROTUNDO DSK:[id number] DOB: 21-Jul-1956 DOA: 01/01/2022 ? ?PCP: Pcp, No  ?Patient coming from: Home ? ?I have personally briefly reviewed patient's old medical records in [place] ? ?Chief Complaint: Right lower extremity weakness ? ?HPI: ?Meredith Randolph is a 66 y.o. female with medical history significant for hypertension, hyperlipidemia, former tobacco use who presented to the ED for evaluation of right lower extremity numbness and weakness. ? ?Patient states she first noticed numbness in her lips about 1 week ago.  She did not think much of the symptoms as they were intermittent and did not affect her speech or swallowing ability.  Morning of 3/18 around 3-3:30 AM she noticed new weakness to her right leg when she got up to use the bathroom.  She says her right leg was dragging when she was attempting to walk.  This weakness lasted about 30 minutes before improving although not completely back to her baseline.  She also had associated numbness in the tips of her fingers in the right hand.  She reports feeling like her heart was racing but otherwise denies any chest pain, dyspnea, change in vision, headache, nausea, vomiting, left-sided weakness.  She has some recurrence and worsening of her symptoms later in the day therefore she came to the ED for further evaluation. ? ?Patient states that she has not taken any medications since the beginning of the COVID-19 pandemic and has been lost to follow-up since.  She is a former smoker, quit over 20 years ago.  She denies any alcohol or illicit drug use. ? ?ED Course  Labs/Imaging on admission: I have personally reviewed following labs and imaging studies. ? ?Initial vitals showed BP 180/104, pulse 86, RR 18, temp 98.4 ?F, SPO2 96% on room air. ? ?Labs show sodium 140, potassium 3.8, bicarb 27, BUN 18, creatinine 1.05, serum glucose 105, AST 28, ALT 30, alk phos 137, total bilirubin 0.4, WBC 8.0, hemoglobin 13.7, platelets  226,000, serum ethanol <10. ? ?Urinalysis negative for UTI.  UDS negative.  SARS-CoV-2 and influenza PCR negative. ? ?CT head without contrast is negative for acute intracranial abnormality.  Punctate lacunar infarct in the anterior limb of the right internal capsule and mild to moderate chronic small vessel ischemia noted. ? ?EDP discussed with on-call neurology who recommended admission for further TIA/stroke work-up.  The hospitalist service was consulted to admit for further evaluation and management. ? ?Review of Systems: All systems reviewed and are negative except as documented in history of present illness above. ? ? ?Past Medical History:  ?Diagnosis Date  ? Hypertension   ? ? ?History reviewed. No pertinent surgical history. ? ?Social History: ? reports that she quit smoking about 22 years ago. She started smoking about 42 years ago. She has a 20.00 pack-year smoking history. She has never used smokeless tobacco. She reports that she does not drink alcohol and does not use drugs. ? ?No Known Allergies ? ?Family History  ?Problem Relation Age of Onset  ? Lung cancer Father   ?     smoker  ? ? ? ?Prior to Admission medications   ?Medication Sig Start Date End Date Taking? Authorizing Provider  ?albuterol (PROVENTIL HFA;VENTOLIN HFA) 108 (90 Base) MCG/ACT inhaler Inhale 2 puffs into the lungs every 4 (four) hours as needed for wheezing or shortness of breath (cough, shortness of breath or wheezing.). 02/16/17   Harrison Mons, PA  ?amLODipine (NORVASC) 5 MG tablet Take 1 tablet (5 mg total)  by mouth daily. 05/03/19   Maximiano Coss, NP  ?losartan-hydrochlorothiazide [facility]) 100-25 MG tablet TAKE 1 TABLET BY MOUTH EVERY DAY 04/02/20   Maximiano Coss, NP  ? ? ?Physical Exam: ?Vitals:  ? 01/01/22 1957 01/01/22 2003 01/01/22 2134  ?BP: (!) 180/104  (!) 171/111  ?Pulse: 86  63  ?Resp: 18  20  ?Temp: 98.4 ?F (36.9 ?C)  98.2 ?F (36.8 ?C)  ?TempSrc: Oral    ?SpO2: 96% 96% 98%  ?Weight:  95.3 kg   ?Height:  '5\' 8"'   (1.727 m)   ? ?Constitutional: Resting in bed with head elevated, NAD, calm, comfortable ?Eyes: PERRL, EOMI, lids and conjunctivae normal ?ENMT: Mucous membranes are moist. Posterior pharynx clear of any exudate or lesions.Normal dentition.  ?Neck: normal, supple, no masses. ?Respiratory: clear to auscultation bilaterally, no wheezing, no crackles. Normal respiratory effort. No accessory muscle use.  ?Cardiovascular: Regular rate and rhythm, no murmurs / rubs / gallops. No extremity edema. 2+ pedal pulses. ?Abdomen: no tenderness, no masses palpated. No hepatosplenomegaly.  ?Musculoskeletal: no clubbing / cyanosis. No joint deformity upper and lower extremities. Good ROM, no contractures. Normal muscle tone.  ?Skin: no rashes, lesions, ulcers. No induration ?Neurologic: Subtle right facial droop otherwise CN 2-12 grossly intact. Sensation intact. Strength 4/5 RLE otherwise 5/5 in bilateral upper extremities and LLE. ?Psychiatric: Normal judgment and insight. Alert and oriented x 3. Normal mood.  ? ?EKG: Personally reviewed. Sinus arrhythmia, rate 65, no acute ischemic changes. ? ?Assessment/Plan ?Principal Problem: ?  Weakness of right lower extremity ?Active Problems: ?  Essential hypertension, benign ?  Hyperlipidemia ?  Elevated serum alkaline phosphatase level ?  ?Meredith Randolph is a 66 y.o. female with medical history significant for hypertension, hyperlipidemia, former tobacco use who presented with acute right lower extremity weakness and is admitted for CVA work-up ? ?Assessment and Plan: ?* Weakness of right lower extremity ?Presenting with acute RLE weakness.  Neurology recommending admission for further TIA/stroke work-up.  CT head negative for acute changes, punctate lacunar infarct in anterior limb of right internal capsule noted. ?-Neurology to follow ?-Obtain MRI brain ?-MRA head and neck ?-Echocardiogram ?-Start on aspirin 81 mg daily ?-Monitor on telemetry, continue neurochecks ?-Check A1c and  lipid panel ?-PT/OT/SLP eval ?-Allow permissive hypertension for now ? ?Essential hypertension, benign ?Patient has not been taking antihypertensives since the beginning of the COVID-19 pandemic. ?-Allow permissive hypertension for now ? ?Hyperlipidemia ?Check lipid panel, would likely benefit from statin. ? ?Elevated serum alkaline phosphatase level ?Patient with chronic mild alkaline phosphatase elevation.  Prior work-up in 2015 showed chronic CBD dilation and slightly inhomogenous liver appearance.  Bone isoenzymes were low-end macro hepatic isoenzymes were high.  Patient denies any abdominal symptoms. ? ?DVT prophylaxis: enoxaparin (LOVENOX) injection 40 mg Start: 01/02/22 1000 ?Code Status: DNR, confirmed on admission. ?Family Communication: Discussed with patient's husband at bedside. ?Disposition Plan: From home and likely discharge to home pending clinical progress ?Consults called: Neurology ?Severity of Illness: ?The appropriate patient status for this patient is OBSERVATION. Observation status is judged to be reasonable and necessary in order to provide the required intensity of service to ensure the patient's safety. The patient's presenting symptoms, physical exam findings, and initial radiographic and laboratory data in the context of their medical condition is felt to place them at decreased risk for further clinical deterioration. Furthermore, it is anticipated that the patient will be medically stable for discharge from the hospital within 2 midnights of admission.   ?Zada Finders MD ?[facility] ? ?  If 7PM-7AM, please contact night-coverage ?[email or web address] ? ?01/01/2022, 11:34 PM  ?

## 2022-01-02 ENCOUNTER — Observation Stay (HOSPITAL_COMMUNITY): Payer: Medicare Other

## 2022-01-02 ENCOUNTER — Observation Stay (HOSPITAL_BASED_OUTPATIENT_CLINIC_OR_DEPARTMENT_OTHER): Payer: Medicare Other

## 2022-01-02 DIAGNOSIS — R29898 Other symptoms and signs involving the musculoskeletal system: Secondary | ICD-10-CM | POA: Diagnosis not present

## 2022-01-02 DIAGNOSIS — I6389 Other cerebral infarction: Secondary | ICD-10-CM | POA: Diagnosis not present

## 2022-01-02 DIAGNOSIS — E7800 Pure hypercholesterolemia, unspecified: Secondary | ICD-10-CM | POA: Diagnosis not present

## 2022-01-02 DIAGNOSIS — E669 Obesity, unspecified: Secondary | ICD-10-CM

## 2022-01-02 DIAGNOSIS — I10 Essential (primary) hypertension: Secondary | ICD-10-CM | POA: Diagnosis not present

## 2022-01-02 DIAGNOSIS — I5032 Chronic diastolic (congestive) heart failure: Secondary | ICD-10-CM

## 2022-01-02 DIAGNOSIS — G459 Transient cerebral ischemic attack, unspecified: Secondary | ICD-10-CM

## 2022-01-02 LAB — HIV ANTIBODY (ROUTINE TESTING W REFLEX): HIV Screen 4th Generation wRfx: NONREACTIVE

## 2022-01-02 LAB — COMPREHENSIVE METABOLIC PANEL
ALT: 25 U/L (ref 0–44)
AST: 24 U/L (ref 15–41)
Albumin: 3.5 g/dL (ref 3.5–5.0)
Alkaline Phosphatase: 117 U/L (ref 38–126)
Anion gap: 8 (ref 5–15)
BUN: 14 mg/dL (ref 8–23)
CO2: 24 mmol/L (ref 22–32)
Calcium: 8.9 mg/dL (ref 8.9–10.3)
Chloride: 104 mmol/L (ref 98–111)
Creatinine, Ser: 0.82 mg/dL (ref 0.44–1.00)
GFR, Estimated: >60 mL/min (ref >60)
Glucose, Bld: 93 mg/dL (ref 70–99)
Potassium: 3.5 mmol/L (ref 3.5–5.1)
Sodium: 136 mmol/L (ref 135–145)
Total Bilirubin: 0.5 mg/dL (ref 0.3–1.2)
Total Protein: 6.9 g/dL (ref 6.5–8.1)

## 2022-01-02 LAB — LIPID PANEL
Cholesterol: 210 mg/dL — ABNORMAL HIGH (ref 0–200)
HDL: 63 mg/dL (ref >40)
LDL Cholesterol: 140 mg/dL — ABNORMAL HIGH (ref 0–99)
Total CHOL/HDL Ratio: 3.3 RATIO
Triglycerides: 35 mg/dL (ref <150)
VLDL: 7 mg/dL (ref 0–40)

## 2022-01-02 LAB — ECHOCARDIOGRAM COMPLETE
AR max vel: 1.09 cm2
AV Area VTI: 1.05 cm2
AV Area mean vel: 1.05 cm2
AV Mean grad: 6 mmHg
AV Peak grad: 11 mmHg
Ao pk vel: 1.66 m/s
Area-P 1/2: 3.42 cm2
Calc EF: 64.1 %
Height: 68 in
S' Lateral: 2.6 cm
Single Plane A2C EF: 69.3 %
Single Plane A4C EF: 60.1 %
Weight: 3449.76 oz

## 2022-01-02 LAB — CBC
HCT: 39.3 % (ref 36.0–46.0)
Hemoglobin: 12.8 g/dL (ref 12.0–15.0)
MCH: 29.6 pg (ref 26.0–34.0)
MCHC: 32.6 g/dL (ref 30.0–36.0)
MCV: 90.8 fL (ref 80.0–100.0)
Platelets: 204 10*3/uL (ref 150–400)
RBC: 4.33 MIL/uL (ref 3.87–5.11)
RDW: 14.1 % (ref 11.5–15.5)
WBC: 5.8 10*3/uL (ref 4.0–10.5)
nRBC: 0 % (ref 0.0–0.2)

## 2022-01-02 LAB — HEMOGLOBIN A1C
Hgb A1c MFr Bld: 5.5 % (ref 4.8–5.6)
Mean Plasma Glucose: 111.15 mg/dL

## 2022-01-02 MED ORDER — ROSUVASTATIN CALCIUM 20 MG PO TABS
20.0000 mg | ORAL_TABLET | Freq: Every day | ORAL | Status: DC
  Administered 2022-01-02: 20 mg via ORAL
  Filled 2022-01-02: qty 1

## 2022-01-02 MED ORDER — CLOPIDOGREL BISULFATE 75 MG PO TABS: 75.0000 mg | ORAL_TABLET | Freq: Every day | ORAL | 0 refills | Status: AC

## 2022-01-02 MED ORDER — GADOBUTROL 1 MMOL/ML IV SOLN
9.0000 mL | Freq: Once | INTRAVENOUS | Status: AC | PRN
  Administered 2022-01-02: 9 mL via INTRAVENOUS

## 2022-01-02 MED ORDER — AMLODIPINE BESYLATE 5 MG PO TABS: 5.0000 mg | ORAL_TABLET | Freq: Every day | ORAL | 1 refills | Status: DC

## 2022-01-02 MED ORDER — ASPIRIN 81 MG PO TBEC: 81.0000 mg | DELAYED_RELEASE_TABLET | Freq: Every day | ORAL | 1 refills | Status: AC

## 2022-01-02 MED ORDER — ROSUVASTATIN CALCIUM 20 MG PO TABS: 20.0000 mg | ORAL_TABLET | Freq: Every day | ORAL | 1 refills | Status: DC

## 2022-01-02 MED ORDER — CLOPIDOGREL BISULFATE 75 MG PO TABS
75.0000 mg | ORAL_TABLET | Freq: Every day | ORAL | Status: DC
  Administered 2022-01-02: 75 mg via ORAL
  Filled 2022-01-02: qty 1

## 2022-01-02 MED ORDER — LOSARTAN POTASSIUM-HCTZ 100-25 MG PO TABS: 1.0000 | ORAL_TABLET | Freq: Every day | ORAL | 1 refills | Status: DC

## 2022-01-02 NOTE — Progress Notes (Signed)
? ?  Echocardiogram ?2D Echocardiogram has been performed. ? ?Meredith Randolph ?01/02/2022, 10:40 AM ?

## 2022-01-02 NOTE — Evaluation (Signed)
Occupational Therapy Evaluation ?Patient Details ?Name: Meredith Randolph ?MRN: [id number] ?DOB: 12/02/1955 ?Today's Date: 01/02/2022 ? ? ?History of Present Illness 66 y.o. female who presented to the ED 01/01/22 for evaluation of right lower extremity numbness and weakness. CT head negative; MRI no acute changes  PMH significant for hypertension, hyperlipidemia, former tobacco use  ? ?Clinical Impression ?  ?Pt admitted for concerns listed above. PTA pt reported that she was independent with all ADL's and IADL's, including gardening and canning. At this time, pt presents at/near her baseline. Her vision and cognition are [facility] and she is independent with all ADL's and functional mobility. Pt has no further OT needs and acute OT will sign off.   ?   ? ?Recommendations for follow up therapy are one component of a multi-disciplinary discharge planning process, led by the attending physician.  Recommendations may be updated based on patient status, additional functional criteria and insurance authorization.  ? ?Follow Up Recommendations ? No OT follow up  ?  ?Assistance Recommended at Discharge None  ?Patient can return home with the following   ? ?  ?Functional Status Assessment ? Patient has had a recent decline in their functional status and demonstrates the ability to make significant improvements in function in a reasonable and predictable amount of time.  ?Equipment Recommendations ? None recommended by OT  ?  ?Recommendations for Other Services   ? ? ?  ?Precautions / Restrictions Precautions ?Precautions: Fall ?Restrictions ?Weight Bearing Restrictions: No  ? ?  ? ?Mobility Bed Mobility ?Overal bed mobility: Independent ?  ?  ?  ?  ?  ?  ?  ?  ? ?Transfers ?Overall transfer level: Modified independent ?Equipment used: None ?Transfers: Sit to/from Stand ?Sit to Stand: Min guard, Modified independent (Device/Increase time) ?  ?  ?  ?  ?  ?General transfer comment: No difficulties from bed or toilet ?  ? ?  ?Balance  Overall balance assessment: No apparent balance deficits (not formally assessed) ?  ?  ?  ?  ?  ?  ?  ?  ?  ?  ?  ?  ?  ?  ?  ?  ?  ?  ?   ? ?ADL either performed or assessed with clinical judgement  ? ?ADL Overall ADL's : Independent;At baseline ?  ?  ?  ?  ?  ?  ?  ?  ?  ?  ?  ?  ?  ?  ?  ?  ?  ?  ?  ?   ? ? ? ?Vision Baseline Vision/History: 0 No visual deficits ?Ability to See in Adequate Light: 0 Adequate ?Vision Assessment?: No apparent visual deficits  ?   ?Perception   ?  ?Praxis   ?  ? ?Pertinent Vitals/Pain Pain Assessment ?Pain Assessment: No/denies pain  ? ? ? ?Hand Dominance Right ?  ?Extremity/Trunk Assessment Upper Extremity Assessment ?Upper Extremity Assessment: Overall WFL for tasks assessed ?  ?Lower Extremity Assessment ?Lower Extremity Assessment: Defer to PT evaluation ?  ?Cervical / Trunk Assessment ?Cervical / Trunk Assessment: Other exceptions ?Cervical / Trunk Exceptions: overweight ?  ?Communication Communication ?Communication: No difficulties ?  ?Cognition Arousal/Alertness: Awake/alert ?Behavior During Therapy: [facility] for tasks assessed/performed ?Overall Cognitive Status: Within Functional Limits for tasks assessed ?  ?  ?  ?  ?  ?  ?  ?  ?  ?  ?  ?  ?  ?  ?  ?  ?  ?  ?  ?  General Comments  VSS on RA ? ?  ?Exercises   ?  ?Shoulder Instructions    ? ? ?Home Living Family/patient expects to be discharged to:: Private residence ?Living Arrangements: Spouse/significant other ?Available Help at Discharge: Family ?Type of Home: House ?Home Access: Ramped entrance ?  ?  ?Home Layout: One level ?  ?  ?Bathroom Shower/Tub: Tub/shower unit ?  ?Bathroom Toilet: Standard ?  ?  ?Home Equipment: None ?  ?  ?  ? ?  ?Prior Functioning/Environment Prior Level of Function : Independent/Modified Independent;Driving ?  ?  ?  ?  ?  ?  ?Mobility Comments: Loves to garden and can ?ADLs Comments: Indep ?  ? ?  ?  ?OT Problem List: Impaired balance (sitting and/or standing);Impaired UE functional use ?  ?   ?OT  Treatment/Interventions:    ?  ?OT Goals(Current goals can be found in the care plan section) Acute Rehab OT Goals ?Patient Stated Goal: To go home ?OT Goal Formulation: With patient ?Time For Goal Achievement: 01/02/22 ?Potential to Achieve Goals: Good  ?OT Frequency:   ?  ? ?Co-evaluation   ?  ?  ?  ?  ? ?  ?AM-PAC OT "6 Clicks" Daily Activity     ?Outcome Measure Help from another person eating meals?: None ?Help from another person taking care of personal grooming?: None ?Help from another person toileting, which includes using toliet, bedpan, or urinal?: None ?Help from another person bathing (including washing, rinsing, drying)?: None ?Help from another person to put on and taking off regular upper body clothing?: None ?Help from another person to put on and taking off regular lower body clothing?: None ?6 Click Score: 24 ?  ?End of Session Nurse Communication: Mobility status ? ?Activity Tolerance: Patient tolerated treatment well ?Patient left: in bed;with call bell/phone within reach ? ?OT Visit Diagnosis: Unsteadiness on feet (R26.81);Other abnormalities of gait and mobility (R26.89);Muscle weakness (generalized) (M62.81)  ?              ?Time: 2376-2831 ?OT Time Calculation (min): 12 min ?Charges:  OT General Charges ?$OT Visit: 1 Visit ?OT Evaluation ?$OT Eval Moderate Complexity: 1 Mod ? ?Anina Schnake H., OTR/L ?Acute Rehabilitation ? ?Gladyse Corvin Elane Bing Plume ?01/02/2022, 4:10 PM ?

## 2022-01-02 NOTE — Consult Note (Signed)
NEUROLOGY CONSULTATION NOTE  ? ?Date of service: January 02, 2022 ?Patient Name: Meredith Randolph ?MRN:  [id number] ?DOB:  May 04, 1956 ?Reason for consult: "R sided weakness" ?Requesting Provider: Lenore Cordia, MD ?_ _ _   _ __   _ __ _ _  __ __   _ __   __ _ ? ?History of Present Illness  ?Meredith Randolph is a 66 y.o. female with PMH significant for HTN, HLD who reports 1 week of perioral numbness. She went to bed at 2000 on 12/31/21 and woke up at 01/01/22 at 0330 and her R knee buckled and she fell as soon as she got up. This improved a bit during the day but got worse again so she came to the ED. ? ?Endorses hx of HTN, HLD but no DM2. No hx of afibb, no hx of strokes. ? ? ?LKW: 2000 on 12/31/21 ?mRS: 0 ?tNKASE: not offered, outside window ?Thrombectomy: not offered, symptoms not consistent with LVO ?NIHSS components Score: Comment  ?1a Level of Conscious 0[x]  1[]  2[]  3[]      ?1b LOC Questions 0[x]  1[]  2[]       ?1c LOC Commands 0[x]  1[]  2[]       ?2 Best Gaze 0[x]  1[]  2[]       ?3 Visual 0[x]  1[]  2[]  3[]      ?4 Facial Palsy 0[x]  1[]  2[]  3[]      ?5a Motor Arm - left 0[x]  1[]  2[]  3[]  4[]  UN[]    ?5b Motor Arm - Right 0[x]  1[]  2[]  3[]  4[]  UN[]    ?6a Motor Leg - Left 0[x]  1[]  2[]  3[]  4[]  UN[]    ?6b Motor Leg - Right 0[x]  1[]  2[]  3[]  4[]  UN[]    ?7 Limb Ataxia 0[x]  1[]  2[]  3[]  UN[]     ?8 Sensory 0[x]  1[]  2[]  UN[]      ?9 Best Language 0[x]  1[]  2[]  3[]      ?10 Dysarthria 0[x]  1[]  2[]  UN[]      ?11 Extinct. and Inattention 0[x]  1[]  2[]       ?TOTAL: 0   ? ?  ?ROS  ? ?Constitutional Denies weight loss, fever and chills.   ?HEENT Denies changes in vision and hearing.   ?Respiratory Denies SOB and cough.   ?CV Denies palpitations and CP   ?GI Denies abdominal pain, nausea, vomiting and diarrhea.   ?GU Denies dysuria and urinary frequency.   ?MSK Denies myalgia and joint pain.   ?Skin Denies rash and pruritus.   ?Neurological Denies headache and syncope.   ?Psychiatric Denies recent changes in mood. Denies anxiety and depression.    ? ?Past History  ? ?Past Medical History:  ?Diagnosis Date  ? Hypertension   ? ?History reviewed. No pertinent surgical history. ?Family History  ?Problem Relation Age of Onset  ? Lung cancer Father   ?     smoker  ? ?Social History  ? ?Socioeconomic History  ? Marital status: Married  ?  Spouse name: Not on file  ? Number of children: 1  ? Years of education: Not on file  ? Highest education level: Not on file  ?Occupational History  ? Occupation: retired  ?Tobacco Use  ? Smoking status: Former  ?  Packs/day: 1.00  ?  Years: 20.00  ?  Pack years: 20.00  ?  Types: Cigarettes  ?  Start date: 04/16/1979  ?  Quit date: 04/16/1999  ?  Years since quitting: 22.7  ? Smokeless tobacco: Never  ?Vaping Use  ? Vaping Use: Never used  ?Substance and  Sexual Activity  ? Alcohol use: No  ? Drug use: No  ? Sexual activity: Not Currently  ?Other Topics Concern  ? Not on file  ?Social History Narrative  ? Employment: retired.  ? ?Social Determinants of Health  ? ?Financial Resource Strain: Not on file  ?Food Insecurity: Not on file  ?Transportation Needs: Not on file  ?Physical Activity: Not on file  ?Stress: Not on file  ?Social Connections: Not on file  ? ?No Known Allergies ? ?Medications  ?(Not in a hospital admission) ?  ? ?Vitals  ? ?Vitals:  ? 01/01/22 1957 01/01/22 2003 01/01/22 2134  ?BP: (!) 180/104  (!) 171/111  ?Pulse: 86  63  ?Resp: 18  20  ?Temp: 98.4 ?F (36.9 ?C)  98.2 ?F (36.8 ?C)  ?TempSrc: Oral    ?SpO2: 96% 96% 98%  ?Weight:  95.3 kg   ?Height:  5\' 8"  (1.727 m)   ?  ? ?Body mass index is 31.93 kg/m?. ? ?Physical Exam  ? ?General: Laying comfortably in bed; in no acute distress.  ?HENT: Normal oropharynx and mucosa. Normal external appearance of ears and nose.  ?Neck: Supple, no pain or tenderness  ?CV: No JVD. No peripheral edema.  ?Pulmonary: Symmetric Chest rise. Normal respiratory effort.  ?Abdomen: Soft to touch, non-tender.  ?Ext: No cyanosis, edema, or deformity  ?Skin: No rash. Normal palpation of skin.    ?Musculoskeletal: Normal digits and nails by inspection. No clubbing.  ? ?Neurologic Examination  ?Mental status/Cognition: Alert, oriented to self, place, month and year, good attention.  ?Speech/language: Fluent, comprehension intact, object naming intact, repetition intact.  ?Cranial nerves:  ? CN II Pupils equal and reactive to light, no VF deficits   ? CN III,IV,VI EOM intact, no gaze preference or deviation, no nystagmus   ? CN V normal sensation in V1, V2, and V3 segments bilaterally   ? CN VII no asymmetry, no nasolabial fold flattening   ? CN VIII normal hearing to speech   ? CN IX & X normal palatal elevation, no uvular deviation   ? CN XI 5/5 head turn and 5/5 shoulder shrug bilaterally   ? CN XII midline tongue protrusion   ? ?Motor:  ?Muscle bulk: normal, tone normal, pronator drift none tremor none ?Mvmt Root Nerve  Muscle Right Left Comments  ?SA C5/6 Ax Deltoid 5 5   ?EF C5/6 Mc Biceps 5 5   ?EE C6/7/8 Rad Triceps 5 5   ?WF C6/7 Med FCR     ?WE C7/8 PIN ECU     ?F Ab C8/T1 U ADM/FDI 4+ 5   ?HF L1/2/3 Fem Illopsoas 4 5   ?KE L2/3/4 Fem Quad 5 5   ?DF L4/5 D Peron Tib Ant 5 5   ?PF S1/2 Tibial Grc/Sol 5 5   ? ?Reflexes: ? Right Left Comments  ?Pectoralis     ? Biceps (C5/6) 2 2   ?Brachioradialis (C5/6) 2 2   ? Triceps (C6/7) 2 2   ? Patellar (L3/4) 2 2   ? Achilles (S1)     ? Hoffman     ? Plantar     ?Jaw jerk   ? ?Sensation: ? Light touch Intact throughout  ? Pin prick   ? Temperature   ? Vibration   ?Proprioception   ? ?Coordination/Complex Motor:  ?- Finger to Nose intact BL ?- Heel to shin intact BL ?- Rapid alternating movement are normal ?- Gait: deferred. ? ?Labs  ? ?CBC:  ?Recent  Labs  ?Lab 01/01/22 ?2045 01/01/22 ?2312  ?WBC 8.0  --   ?NEUTROABS 3.9  --   ?HGB 13.7 14.6  ?HCT 42.7 43.0  ?MCV 91.4  --   ?PLT 226  --   ? ? ?Basic Metabolic Panel:  ?Lab Results  ?Component Value Date  ? NA 142 01/01/2022  ? K 3.8 01/01/2022  ? CO2 27 01/01/2022  ? GLUCOSE 93 01/01/2022  ? BUN 21 01/01/2022  ?  CREATININE 1.10 (H) 01/01/2022  ? CALCIUM 9.4 01/01/2022  ? GFRNONAA 59 (L) 01/01/2022  ? GFRAA 83 04/16/2019  ? ?Lipid Panel:  ?Lab Results  ?Component Value Date  ? LDLCALC 152 (H) 04/16/2019  ? ?HgbA1c:  ?Lab Results  ?Component Value Date  ? HGBA1C 5.7 (H) 04/16/2019  ? ?Urine Drug Screen:  ?   ?Component Value Date/Time  ? [place] DETECTED 01/01/2022 2045  ? [place] DETECTED 01/01/2022 2045  ? [place] DETECTED 01/01/2022 2045  ? AMPHETMU NONE DETECTED 01/01/2022 2045  ? [place] DETECTED 01/01/2022 2045  ? [place] DETECTED 01/01/2022 2045  ?  ?Alcohol Level  ?   ?Component Value Date/Time  ? ETH <10 01/01/2022 2045  ? ? ?CT Head without contrast(Personally reviewed): ?CTH was negative for a large hypodensity concerning for a large territory infarct or hyperdensity concerning for an ICH ? ?MR Angio head without contrast and Carotid Duplex BL: ?pending ? ?MRI Brain: ?pending ? ? ?Impression  ? ?Meredith Randolph is a 66 y.o. female with PMH significant for HTN, HLD who presents with RUE and RLE weakness. Symptoms are most consistent with a small vessel stroke. Her neurologic examination is notable for mild R hip flexion weakness and mild R hand grip weakness. ? ?Primary Diagnosis:  ?Other cerebral infarction due to occlusion of stenosis of small artery. ? ?Secondary Diagnosis: ?Essential (primary) hypertension ? ?Recommendations  ? ?- Frequent Neuro checks per stroke unit protocol ?- Recommend brain imaging with MRI Brain without contrast ?- Recommend Vascular imaging with MRA Angio Head without contrast and MR angio neck with contrast. ?- Recommend obtaining TTE ?- Recommend obtaining Lipid panel with LDL ?- Please start statin if LDL > 70 ?- Recommend HbA1c ?- Antithrombotic - Asprin 81mg  daily along with plavix 75mg  daily x 21 days followed by Aspirin 81mg  daily alone. ?- Recommend DVT ppx ?- SBP goal - permissive hypertension first 24 h < 220/110. Held home meds.  ?- Recommend  Telemetry monitoring for arrythmia ?- Recommend bedside swallow screen prior to PO intake. ?- Stroke education booklet ?- Recommend PT/OT/SLP consult ? ?__________________________________________________________

## 2022-01-02 NOTE — Discharge Summary (Addendum)
? ?Physician Discharge Summary  ?Meredith Randolph:[id number] DOB: [birth year] DOA: 01/01/2022 ? ?PCP: Pcp, No ? ?Admit date: 01/01/2022 ?Discharge date: 01/02/2022 ? ?Admitted From: home ?Disposition:  home ? ?Recommendations for Outpatient Follow-up:  ?Follow up with PCP in 1-2 weeks ? ?Home Health: none ?Equipment/Devices: none ? ?Discharge Condition: stable ?CODE STATUS: Full code ?Diet recommendation: heart healthy ? ?HPI: Per admitting MD, ?Meredith Randolph is a 66 y.o. female with medical history significant for hypertension, hyperlipidemia, former tobacco use who presented to the ED for evaluation of right lower extremity numbness and weakness. Patient states she first noticed numbness in her lips about 1 week ago.  She did not think much of the symptoms as they were intermittent and did not affect her speech or swallowing ability.  Morning of 3/18 around 3-3:30 AM she noticed new weakness to her right leg when she got up to use the bathroom.  She says her right leg was dragging when she was attempting to walk.  This weakness lasted about 30 minutes before improving although not completely back to her baseline.  She also had associated numbness in the tips of her fingers in the right hand.  She reports feeling like her heart was racing but otherwise denies any chest pain, dyspnea, change in vision, headache, nausea, vomiting, left-sided weakness.  She has some recurrence and worsening of her symptoms later in the day therefore she came to the ED for further evaluation. Patient states that she has not taken any medications since the beginning of the COVID-19 pandemic and has been lost to follow-up since.  She is a former smoker, quit over 20 years ago.  She denies any alcohol or illicit drug use. ? ?Hospital Course / Discharge diagnoses: ? ?Assessment and Plan: ?Principal Problem: ?  TIA (transient ischemic attack) ?Active Problems: ?  Essential hypertension, benign ?  Hyperlipidemia ?  Elevated serum alkaline  phosphatase level ?  Obesity, Class I, BMI 30-34.9 ?  Chronic diastolic CHF (congestive heart failure) ([place]) ? ?Principal problem ?TIA-patient was admitted to the hospital with transient and intermittent right lower extremity weakness as well as perioral numbness.  Neurology consulted and followed patient while hospitalized.  She underwent a stroke work-up.  MRI of the brain was negative for a CVA, an MRA was without any large vessel occlusions.  A 2D echo was done which showed an EF of 60-65%, no WMA, and had grade 2 diastolic dysfunction.  A1c was 5.5.  Lipid panel showed an LDL of 140, she was placed on a statin.  PT did not recommend any PT follow-up.  She is close to baseline, will be discharged home in stable condition, neurology recommends aspirin and Plavix for 3 weeks followed by aspirin alone. ? ?Active problems ?Chronic diastolic CHF-Echo shows grade 2 diastolic dysfunction.  She would benefit from weight loss, she was advised to follow low-sodium diet ?Obesity, class I-based on a calculated BMI of 32.7.  She would benefit from weight loss ?Essential hypertension-recommend to continue home regimen ?Hyperlipidemia-started on statin ?Elevated serum alk phos-this appears to be chronic, she is asymptomatic ? ? ?Sepsis ruled out ? ? ?Discharge Instructions ? ? ?Allergies as of 01/02/2022   ?No Known Allergies ?  ? ?  ?Medication List  ?  ? ?TAKE these medications   ? ?albuterol 108 (90 Base) MCG/ACT inhaler ?Commonly known as: VENTOLIN HFA ?Inhale 2 puffs into the lungs every 4 (four) hours as needed for wheezing or shortness of breath (cough, shortness of  breath or wheezing.). ?  ?amLODipine 5 MG tablet ?Commonly known as: NORVASC ?Take 1 tablet (5 mg total) by mouth daily. ?  ?aspirin 81 MG EC tablet ?Take 1 tablet (81 mg total) by mouth daily. Swallow whole. ?Start taking on: January 03, 2022 ?  ?clopidogrel 75 MG tablet ?Commonly known as: PLAVIX ?Take 1 tablet (75 mg total) by mouth daily for 21 days. ?Start  taking on: January 03, 2022 ?  ?losartan-hydrochlorothiazide 100-25 MG tablet ?Commonly known as: HYZAAR ?TAKE 1 TABLET BY MOUTH EVERY DAY ?  ?ONE-A-DAY WOMENS PO ?Take 3 tablets by mouth daily. Gummy ?  ?rosuvastatin 20 MG tablet ?Commonly known as: CRESTOR ?Take 1 tablet (20 mg total) by mouth daily. ?Start taking on: January 03, 2022 ?  ? ?  ? ? Follow-up Information   ? ? Primary Care at [facility] Follow up.   ?Specialty: Family Medicine ?Why: for primary care. The office will call with an appointment ?Contact information: ?[address] ?[place] ?[phone] ? ?  ?  ? ?  ?  ? ?  ? ? ?Consultations: ?Neurology  ? ?Procedures/Studies: ? ?CT HEAD WO CONTRAST ? ?Result Date: 01/01/2022 ?CLINICAL DATA:  Neuro deficit, acute, stroke suspected Right leg weakness.  Right facial tingling. EXAM: CT HEAD WITHOUT CONTRAST TECHNIQUE: Contiguous axial images were obtained from the base of the skull through the vertex without intravenous contrast. RADIATION DOSE REDUCTION: This exam was performed according to the departmental dose-optimization program which includes automated exposure control, adjustment of the mA and/or kV according to patient size and/or use of iterative reconstruction technique. COMPARISON:  Remote head CT 07/12/2006 FINDINGS: Brain: No intracranial hemorrhage, mass effect, or midline shift. Brain volume is normal for age. No hydrocephalus. The basilar cisterns are patent. Mild to moderate periventricular white matter hypodensity typical of chronic small vessel ischemia. There is a punctate lacunar infarct in the anterior limb of the right internal capsule. No evidence of territorial infarct or acute ischemia. No extra-axial or intracranial fluid collection. Vascular: No hyperdense vessel or unexpected calcification. Skull: No fracture. 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 Sinuses/Orbits: Paranasal sinuses and mastoid air cells are clear. The visualized orbits are unremarkable. Other: None. IMPRESSION: 1. No acute intracranial abnormality. 2. Mild to moderate chronic small vessel ischemia. Punctate lacunar infarct in the anterior limb of the right internal capsule. Electronically Signed   By: Keith Rake M.D.   On: 01/01/2022 21:07  ? ?MR ANGIO HEAD WO CONTRAST ? ?Result Date: 01/02/2022 ?CLINICAL DATA:  Transient ischemic attack (TIA); Neuro deficit, acute, stroke suspected; stroke workup EXAM: MRI HEAD WITHOUT CONTRAST MRA HEAD WITHOUT CONTRAST MRA NECK WITHOUT AND WITH CONTRAST TECHNIQUE: Multiplanar, multi-echo pulse sequences of the brain and surrounding structures were acquired without intravenous contrast. Angiographic images of the Circle of Willis were acquired using MRA technique without intravenous contrast. Angiographic images of the neck were acquired using MRA technique without and with intravenous contrast. Carotid stenosis measurements (when applicable) are obtained utilizing NASCET criteria, using the distal internal carotid diameter as the denominator. CONTRAST:  56m GADAVIST GADOBUTROL 1 MMOL/ML IV SOLN COMPARISON:  March 18, 23. FINDINGS: MRI HEAD FINDINGS Brain: No acute infarction, acute hemorrhage, hydrocephalus, extra-axial collection or mass lesion. No pathologic intracranial enhancement. Patchy white matter T2/FLAIR hyperintensities, nonspecific compatible with chronic microvascular ischemic disease. Small foci of susceptibility artifact and T2 hypointensity within  the right basal ganglia and parieto-occipital region which probably represents prior microhemorrhage and/or cavernous malformation. 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 Otherwise, normal marrow signal. Sinuses/Orbits: Visualized sinuses are clear.  Unremarkable orbits. Other: No mastoid effusions. MRA HEAD FINDINGS Anterior circulation: Bilateral intracranial

## 2022-01-02 NOTE — Progress Notes (Signed)
Instructions and follow up reviewed with patient and her husband. Verbalized understanding. ?

## 2022-01-02 NOTE — ED Notes (Signed)
Pt ambulatory to restroom and back.

## 2022-01-02 NOTE — Progress Notes (Addendum)
STROKE TEAM PROGRESS NOTE  ? ?INTERVAL HISTORY ?No family at bedside. Patient is seen sitting up on the side of the bed comfortable, awake, alert. She reported having paresthesia localized only to her top and bottom and b/l lip. This started about 1 week ago and progressed slowly. On 01/01/2022, she reported right lower extremity weakness where her knee buckled and she fell. Weakness lasted 30-60 mins. She denied weakness prior to 3/18. Also denied HA, N/V, dizziness. Paresthesia and weakness has resolved today. 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 Denied h/o past stroke, diabetes, EtOH abuse, tobacco abuse, or any substance abuse. Patient was amenable to plan and had no other concerns. ? ?Vitals:  ? 01/02/22 0300 01/02/22 0430 01/02/22 0727 01/02/22 1201  ?BP: (!) 194/92 (!) 167/88 (!) 175/99 (!) 190/90  ?Pulse: (!) 53 64 66 72  ?Resp: 18 18 18 18   ?Temp:  98.1 ?F (36.7 ?C) 97.6 ?F (36.4 ?C) 97.6 ?F (36.4 ?C)  ?TempSrc:  Oral Oral Oral  ?SpO2: 98% 98% 100% 98%  ?Weight:  97.8 kg    ?Height:      ? ?CBC:  ?Recent Labs  ?Lab 01/01/22 ?2045 01/01/22 ?2312 01/02/22 ?01/04/22  ?WBC 8.0  --  5.8  ?NEUTROABS 3.9  --   --   ?HGB 13.7 14.6 12.8  ?HCT 42.7 43.0 39.3  ?MCV 91.4  --  90.8  ?PLT 226  --  204  ? ?Basic Metabolic Panel:  ?Recent Labs  ?Lab 01/01/22 ?2045 01/01/22 ?2312 01/02/22 ?01/04/22  ?NA 140 142 136  ?K 3.8 3.8 3.5  ?CL 104 104 104  ?CO2 27  --  24  ?GLUCOSE 105* 93 93  ?BUN 18 21 14   ?CREATININE 1.05* 1.10* 0.82  ?CALCIUM 9.4  --  8.9  ? ?Lipid Panel:  ?Recent Labs  ?Lab 01/02/22 ?  ?CHOL 210*  ?TRIG 35  ?HDL 63  ?CHOLHDL 3.3  ?VLDL 7  ?LDLCALC 140*  ? ?HgbA1c:  ?Recent Labs  ?Lab 01/02/22 ?0813  ?HGBA1C 5.5  ? ?Urine Drug Screen:  ?Recent Labs  ?Lab 01/01/22 ?2045  ?LABOPIA NONE DETECTED  ?COCAINSCRNUR NONE DETECTED  ?LABBENZ NONE DETECTED  ?AMPHETMU NONE DETECTED  ?THCU NONE DETECTED  ?LABBARB NONE DETECTED   ? ?Alcohol Level  ?Recent Labs  ?Lab 01/01/22 ?2045  ?ETH <10  ? ? ?IMAGING past 24 hours ?CT HEAD WO CONTRAST ? ?Result Date: 01/01/2022 ?CLINICAL DATA:  Neuro deficit, acute, stroke suspected Right leg weakness.  Right facial tingling. EXAM: CT HEAD WITHOUT CONTRAST TECHNIQUE: Contiguous axial images were obtained from the base of the skull through the vertex without intravenous contrast. RADIATION DOSE REDUCTION: This exam was performed according to the departmental dose-optimization program which includes automated exposure control, adjustment of the mA and/or kV according to patient size and/or use of iterative reconstruction technique. COMPARISON:  Remote head CT 07/12/2006 FINDINGS: Brain: No intracranial hemorrhage, mass effect, or midline shift. Brain volume is normal for age. No hydrocephalus. The basilar cisterns are patent. Mild to moderate periventricular white matter hypodensity typical of chronic small vessel ischemia. There is a punctate lacunar infarct in the anterior limb of the right internal capsule. No evidence of territorial infarct or acute ischemia. No extra-axial or intracranial fluid collection. Vascular: No hyperdense vessel or unexpected calcification. Skull: No fracture. 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 Sinuses/Orbits: Paranasal sinuses and mastoid air cells are clear. The visualized orbits are unremarkable. Other: None. IMPRESSION: 1. No acute intracranial abnormality. 2. Mild to moderate chronic small vessel ischemia. Punctate lacunar infarct in the anterior limb of the right internal capsule. Electronically Signed   By: Narda RutherfordMelanie  Sanford M.D.   On: 01/01/2022 21:07  ? ?MR ANGIO HEAD WO CONTRAST ? ?Result Date: 01/02/2022 ?CLINICAL DATA:  Transient ischemic attack (TIA); Neuro deficit, acute, stroke suspected; stroke workup EXAM: MRI HEAD  WITHOUT CONTRAST MRA HEAD WITHOUT CONTRAST MRA NECK WITHOUT AND WITH CONTRAST TECHNIQUE: Multiplanar, multi-echo pulse sequences of the brain and surrounding structures were acquired without intravenous contrast. Angiographic images of the Circle of Willis were acquired using MRA technique without intravenous contrast. Angiographic images of the neck were acquired using MRA technique without and with intravenous contrast. Carotid stenosis measurements (when applicable) are obtained utilizing NASCET criteria, using the distal internal carotid diameter as the denominator. CONTRAST:  9mL GADAVIST GADOBUTROL 1 MMOL/ML IV SOLN COMPARISON:  March 18, 23. FINDINGS: MRI HEAD FINDINGS Brain: No acute infarction, acute hemorrhage, hydrocephalus, extra-axial collection or mass lesion. No pathologic intracranial enhancement. Patchy white matter T2/FLAIR hyperintensities, nonspecific compatible with chronic microvascular ischemic disease. Small foci of susceptibility artifact and T2 hypointensity within the right basal ganglia and parieto-occipital region which probably represents prior microhemorrhage and/or cavernous malformation. 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 Otherwise, normal marrow signal. Sinuses/Orbits: Visualized sinuses are clear.  Unremarkable orbits. Other: No mastoid effusions. MRA HEAD FINDINGS Anterior circulation: Bilateral intracranial ICAs, MCAs, and ACAs are patent without proximal hemodynamically significant stenosis. No aneurysm identified. Posterior circulation: Intradural vertebral arteries, basilar artery and posterior cerebral arteries are patent without proximal hemodynamically significant stenosis. Right posterior communicating artery is present. Multifocal moderate left P2 PCA stenosis. MRA  NECK FINDINGS Motion limited with particularly on limited evaluation proximally. Aortic arch: Limited assessment with great vessel origins appearing patent. Right carotid system: Limited assessment due to artifact proximally. The visible common carotid artery and internal carotid artery are patent without high-grade stenosis. Left carotid system: Limited assessment due to artifact proximally. The visible common carotid artery and internal carotid artery are patent without high-grade stenosis. Vertebral arteries: Essentially nondiagnostic evaluation of the vertebral artery origins due to artifact. The visible vertebral arteries are patent without evidence of high-grade stenosis. Tortuous bilaterally. IMPRESSION: MRI: 1. No acute intracranial abnormality. 2. Moderate chronic microvascular ischemic disease. 3. Small foci of susceptibility artifact and T2 hypointensity within the right basal ganglia and parieto-occipital region which probably represents prior microhemorrhages and/or small cavernous malformations. MRA head: 1. No large vessel occlusion. 2. Multifocal moderate left P2 PCA stenosis. MRA neck: Motion limited (particularly proximally) without visible high-grade stenosis. Electronically Signed   By: Feliberto HartsFrederick S Jones M.D.   On: 01/02/2022 10:34  ? ?MR ANGIO NECK W WO CONTRAST ? ?Result Date: 01/02/2022 ?CLINICAL DATA:  Transient ischemic attack (TIA); Neuro deficit, acute, stroke suspected; stroke workup EXAM: MRI HEAD WITHOUT CONTRAST MRA HEAD WITHOUT CONTRAST MRA NECK WITHOUT AND WITH CONTRAST TECHNIQUE: Multiplanar, multi-echo pulse sequences of the brain and surrounding structures were acquired without intravenous contrast. Angiographic images of the Circle of Willis were acquired using MRA technique without intravenous contrast. Angiographic images of the neck were acquired using MRA technique without  and with intravenous contrast. Carotid stenosis measurements (when applicable) are obtained utilizing  NASCET criteria, using the distal internal carotid diameter as the denominator. CONTRAST:  69mL GADAVIST GADOBUTROL 1 MMOL/ML IV SOLN COMPARISON:  March 18, 23. FINDINGS: MRI HEAD FINDINGS Brain: No acute infarction,

## 2022-01-02 NOTE — Discharge Instructions (Signed)
Follow with Pcp in 5-7 days ? ?Please get a complete blood count and chemistry panel checked by your Primary MD at your next visit, and again as instructed by your Primary MD. Please get your medications reviewed and adjusted by your Primary MD. ? ?Please request your Primary MD to go over all Hospital Tests and Procedure/Radiological results at the follow up, please get all Hospital records sent to your Prim MD by signing hospital release before you go home. ? ?In some cases, there will be blood work, cultures and biopsy results pending at the time of your discharge. Please request that your primary care M.D. goes through all the records of your hospital data and follows up on these results. ? ?If you had Pneumonia of Lung problems at the Hospital: ?Please get a 2 view Chest X ray done in 6-8 weeks after hospital discharge or sooner if instructed by your Primary MD. ? ?If you have Congestive Heart Failure: ?Please call your Cardiologist or Primary MD anytime you have any of the following symptoms:  ?1) 3 pound weight gain in 24 hours or 5 pounds in 1 week  ?2) shortness of breath, with or without a dry hacking cough  ?3) swelling in the hands, feet or stomach  ?4) if you have to sleep on extra pillows at night in order to breathe ? ?Follow cardiac low salt diet and 1.5 lit/day fluid restriction. ? ?If you have diabetes ?Accuchecks 4 times/day, Once in AM empty stomach and then before each meal. ?Log in all results and show them to your primary doctor at your next visit. ?If any glucose reading is under 80 or above 300 call your primary MD immediately. ? ?If you have Seizure/Convulsions/Epilepsy: ?Please do not drive, operate heavy machinery, participate in activities at heights or participate in high speed sports until you have seen by Primary MD or a Neurologist and advised to do so again. ?Per [facility] statutes, patients with seizures are not allowed to drive until they have been seizure-free for six  months.  ?Use caution when using heavy equipment or power tools. Avoid working on ladders or at heights. Take showers instead of baths. Ensure the water temperature is not too high on the home water heater. Do not go swimming alone. Do not lock yourself in a room alone (i.e. bathroom). When caring for infants or small children, sit down when holding, feeding, or changing them to minimize risk of injury to the child in the event you have a seizure. Maintain good sleep hygiene. Avoid alcohol.  ? ?If you had Gastrointestinal Bleeding: ?Please ask your Primary MD to check a complete blood count within one week of discharge or at your next visit. Your endoscopic/colonoscopic biopsies that are pending at the time of discharge, will also need to followed by your Primary MD. ? ?Get Medicines reviewed and adjusted. ?Please take all your medications with you for your next visit with your Primary MD ? ?Please request your Primary MD to go over all hospital tests and procedure/radiological results at the follow up, please ask your Primary MD to get all Hospital records sent to his/her office. ? ?If you experience worsening of your admission symptoms, develop shortness of breath, life threatening emergency, suicidal or homicidal thoughts you must seek medical attention immediately by calling 911 or calling your MD immediately  if symptoms less severe. ? ?You must read complete instructions/literature along with all the possible adverse reactions/side effects for all the Medicines you take and that  have been prescribed to you. Take any new Medicines after you have completely understood and accpet all the possible adverse reactions/side effects.  ? ?Do not drive or operate heavy machinery when taking Pain medications.  ? ?Do not take more than prescribed Pain, Sleep and Anxiety Medications ? ?Special Instructions: If you have smoked or chewed Tobacco  in the last 2 yrs please stop smoking, stop any regular Alcohol  and or any  Recreational drug use. ? ?Wear Seat belts while driving. ? ?Please note ?You were cared for by a hospitalist during your hospital stay. If you have any questions about your discharge medications or the care you received while you were in the hospital after you are discharged, you can call the unit and asked to speak with the hospitalist on call if the hospitalist that took care of you is not available. Once you are discharged, your primary care physician will handle any further medical issues. Please note that NO REFILLS for any discharge medications will be authorized once you are discharged, as it is imperative that you return to your primary care physician (or establish a relationship with a primary care physician if you do not have one) for your aftercare needs so that they can reassess your need for medications and monitor your lab values. ? ?You can reach the hospitalist office at phone [phone] or fax [phone] ?  ?If you do not have a primary care physician, you can call [phone] for a physician referral. ? ?Activity: As tolerated with Full fall precautions use walker/cane & assistance as needed ? ?  ?Diet: heart healthy, low sodium ? ?Disposition Home  ?

## 2022-01-02 NOTE — Progress Notes (Signed)
SLP Cancellation Note ? ?Patient Details ?Name: Meredith Randolph ?MRN: [id number] ?DOB: [birth year] ? ? ?Cancelled treatment:       Reason Eval/Treat Not Completed: SLP screened, no needs identified, will sign off Pt did not present with any acute speech, language, or cognitive-linguistic deficits on admission, MRI was negative, and no overt communication or cognitive-linguistic deficits were noted by physical therapy or occupational therapy. A formal evaluation does not appear to be clinically indicated at this time. SLP will sign off. ? ? ?Landen Knoedler I. Vear Clock, MS, CCC-SLP ?Acute Rehabilitation Services ?Office number [phone] ?Pager [phone] ? ?Scheryl Marten ?01/02/2022, 4:05 PM ?

## 2022-01-02 NOTE — Care Management (Signed)
Spoke w patient to discuss primary care. Records show she was at [facility]. ?She states she would like to follow up/ re-establish there. ?Message sent to CMA to schedule follow up appointment.  ?Unable to schedule over the weekend when offices are closed.  ?

## 2022-01-02 NOTE — ED Notes (Signed)
This RN attempted to call report to 5W x2  ?

## 2022-01-02 NOTE — Evaluation (Signed)
Physical Therapy Evaluation ?Patient Details ?Name: Meredith Randolph ?MRN: [id number] ?DOB: 30-Aug-1956 ?Today's Date: 01/02/2022 ? ?History of Present Illness ? 66 y.o. female who presented to the ED 01/01/22 for evaluation of right lower extremity numbness and weakness. CT head negative; MRI no acute changes  PMH significant for hypertension, hyperlipidemia, former tobacco use  ?Clinical Impression ?  ?Pt admitted secondary to problem above with deficits below. PTA patient was retired and living in a one-level home with spouse. She mobilized independently PTA. Pt currently requires min guard assistance for safety with ambulation due to sense of rt knee wanting to "buckle." Patient walks in slightly guarded fashion due to this (and due to sciatic pain in rt buttocks and posterior thigh).  Anticipate patient will benefit from PT to address problems listed below.Will continue to follow acutely to maximize functional mobility independence and safety.   ?   ?   ? ?Recommendations for follow up therapy are one component of a multi-disciplinary discharge planning process, led by the attending physician.  Recommendations may be updated based on patient status, additional functional criteria and insurance authorization. ? ?Follow Up Recommendations No PT follow up ? ?  ?Assistance Recommended at Discharge PRN  ?Patient can return home with the following ? Help with stairs or ramp for entrance ? ?  ?Equipment Recommendations None recommended by PT  ?Recommendations for Other Services ?    ?  ?Functional Status Assessment Patient has had a recent decline in their functional status and demonstrates the ability to make significant improvements in function in a reasonable and predictable amount of time.  ? ?  ?Precautions / Restrictions Precautions ?Precautions: Fall ?Restrictions ?Weight Bearing Restrictions: No  ? ?  ? ?Mobility ? Bed Mobility ?Overal bed mobility: Independent ?  ?  ?  ?  ?  ?  ?  ?  ? ?Transfers ?Overall transfer  level: Needs assistance ?Equipment used: None ?Transfers: Sit to/from Stand ?Sit to Stand: Min guard ?  ?  ?  ?  ?  ?General transfer comment: required 2 attempts to achieve standing with slight unsteadiness ?  ? ?Ambulation/Gait ?Ambulation/Gait assistance: Min guard ?Gait Distance (Feet): 200 Feet ?Assistive device: None ?Gait Pattern/deviations: Step-through pattern ?  ?Gait velocity interpretation: 1.31 - 2.62 ft/sec, indicative of limited community ambulator ?  ?General Gait Details: pt reports occasionally feels like rt knee is going to buckle, but does not. Not perceived during gait at this time ? ?Stairs ?  ?  ?  ?  ?  ? ?Wheelchair Mobility ?  ? ?Modified Rankin (Stroke Patients Only) ?Modified Rankin (Stroke Patients Only) ?Pre-Morbid Rankin Score: No symptoms ?Modified Rankin: No significant disability ? ?  ? ?Balance Overall balance assessment: No apparent balance deficits (not formally assessed) ?  ?  ?  ?  ?  ?  ?  ?  ?  ?  ?  ?  ?  ?  ?  ?  ?  ?  ?   ? ? ? ?Pertinent Vitals/Pain Pain Assessment ?Pain Assessment: Faces ?Faces Pain Scale: Hurts a little bit ?Pain Location: has "sciatic-like" pain posterior left hip, glutes and down back of thigh ?Pain Descriptors / Indicators: Burning ?Pain Intervention(s): Limited activity within patient's tolerance, Monitored during session  ? ? ?Home Living Family/patient expects to be discharged to:: Private residence ?Living Arrangements: Spouse/significant other ?Available Help at Discharge: Family ?Type of Home: House ?Home Access: Ramped entrance ?  ?  ?  ?Home Layout: One level ?Home Equipment:  None ?   ?  ?Prior Function Prior Level of Function : Independent/Modified Independent;Driving ?  ?  ?  ?  ?  ?  ?  ?  ?  ? ? ?Hand Dominance  ? Dominant Hand: Right ? ?  ?Extremity/Trunk Assessment  ? Upper Extremity Assessment ?Upper Extremity Assessment: Defer to OT evaluation ?  ? ?Lower Extremity Assessment ?Lower Extremity Assessment: Overall WFL for tasks  assessed ?  ? ?Cervical / Trunk Assessment ?Cervical / Trunk Assessment: Other exceptions ?Cervical / Trunk Exceptions: overweight  ?Communication  ? Communication: No difficulties  ?Cognition Arousal/Alertness: Awake/alert ?Behavior During Therapy: [facility] for tasks assessed/performed ?Overall Cognitive Status: Within Functional Limits for tasks assessed ?  ?  ?  ?  ?  ?  ?  ?  ?  ?  ?  ?  ?  ?  ?  ?  ?  ?  ?  ? ?  ?General Comments   ? ?  ?Exercises    ? ?Assessment/Plan  ?  ?PT Assessment Patient needs continued PT services  ?PT Problem List Decreased activity tolerance;Decreased mobility;Decreased knowledge of use of DME ? ?   ?  ?PT Treatment Interventions DME instruction;Gait training;Functional mobility training;Therapeutic activities;Therapeutic exercise;Balance training;Patient/family education   ? ?PT Goals (Current goals can be found in the Care Plan section)  ?Acute Rehab PT Goals ?Patient Stated Goal: return to normal ?PT Goal Formulation: With patient ?Time For Goal Achievement: 01/16/22 ?Potential to Achieve Goals: Good ? ?  ?Frequency Min 3X/week ?  ? ? ?Co-evaluation   ?  ?  ?  ?  ? ? ?  ?AM-PAC PT "6 Clicks" Mobility  ?Outcome Measure Help needed turning from your back to your side while in a flat bed without using bedrails?: None ?Help needed moving from lying on your back to sitting on the side of a flat bed without using bedrails?: None ?Help needed moving to and from a bed to a chair (including a wheelchair)?: A Little ?Help needed standing up from a chair using your arms (e.g., wheelchair or bedside chair)?: None ?Help needed to walk in hospital room?: A Little ?Help needed climbing 3-5 steps with a railing? : A Little ?6 Click Score: 21 ? ?  ?End of Session Equipment Utilized During Treatment: Gait belt ?Activity Tolerance: Patient tolerated treatment well ?Patient left: in bed;with call bell/phone within reach;Other (comment) (with echo present to do study) ?Nurse Communication: Mobility  status ?PT Visit Diagnosis: Other abnormalities of gait and mobility (R26.89) ?  ? ?Time: 1000-1014 ?PT Time Calculation (min) (ACUTE ONLY): 14 min ? ? ?Charges:   PT Evaluation ?$PT Eval Low Complexity: 1 Low ?  ?  ?   ? ? ? ?Jerolyn Center, PT ?Acute Rehabilitation Services  ?Pager [phone] ?Office [phone] ? ? ?Scherrie November Velina Drollinger ?01/02/2022, 11:09 AM ? ?

## 2022-01-04 ENCOUNTER — Other Ambulatory Visit: Payer: Self-pay

## 2022-01-04 ENCOUNTER — Encounter: Payer: Self-pay | Admitting: Nurse Practitioner

## 2022-01-04 ENCOUNTER — Ambulatory Visit (INDEPENDENT_AMBULATORY_CARE_PROVIDER_SITE_OTHER): Payer: Medicare Other | Admitting: Nurse Practitioner

## 2022-01-04 VITALS — BP 158/107 | HR 61 | Resp 18 | Ht 68.0 in | Wt 216.2 lb

## 2022-01-04 DIAGNOSIS — I499 Cardiac arrhythmia, unspecified: Secondary | ICD-10-CM | POA: Diagnosis not present

## 2022-01-04 DIAGNOSIS — G459 Transient cerebral ischemic attack, unspecified: Secondary | ICD-10-CM | POA: Diagnosis not present

## 2022-01-04 DIAGNOSIS — I10 Essential (primary) hypertension: Secondary | ICD-10-CM

## 2022-01-04 DIAGNOSIS — E7800 Pure hypercholesterolemia, unspecified: Secondary | ICD-10-CM | POA: Diagnosis not present

## 2022-01-04 NOTE — Progress Notes (Signed)
@Patient  ID: , female    DOB: Apr 25, 1956, 66 y.o.   MRN: [id number] ? ?Chief Complaint  ?Patient presents with  ? Establish Care  ? ? ?Referring provider: ?No ref. provider found ? ? ? ?HPI ? ?Patient presents today to establish care.  Patient was recently discharged from the hospital after being admitted with hypertension and TIA.  Patient was started on Norvasc, aspirin, Plavix, Hyzaar, Crestor.  She states that she was able to pick up her prescriptions and has been taking medications as directed.  She has started diet changes.  She is trying to eat healthier and eat a low-salt diet.  She is starting a walking routine.  Overall patient has been doing well.  Upon exam her heart rhythm was irregular.  EKG in office today showed normal sinus rhythm with rate variation. Denies f/c/s, n/v/d, hemoptysis, PND, chest pain or edema. ? ? ? ? ? ? ?No Known Allergies ? ?Immunization History  ?Administered Date(s) Administered  ? Influenza-Unspecified 07/05/2019, 08/12/2020  ? ? ?Past Medical History:  ?Diagnosis Date  ? Hypertension   ? ? ?Tobacco History: ?Social History  ? ?Tobacco Use  ?Smoking Status Former  ? Packs/day: 1.00  ? Years: 20.00  ? Pack years: 20.00  ? Types: Cigarettes  ? Start date: 04/16/1979  ? Quit date: 04/16/1999  ? Years since quitting: 22.7  ?Smokeless Tobacco Never  ? ?Counseling given: Not Answered ? ? ?Outpatient Encounter Medications as of 01/04/2022  ?Medication Sig  ? amLODipine (NORVASC) 5 MG tablet Take 1 tablet (5 mg total) by mouth daily.  ? aspirin EC 81 MG EC tablet Take 1 tablet (81 mg total) by mouth daily. Swallow whole.  ? clopidogrel (PLAVIX) 75 MG tablet Take 1 tablet (75 mg total) by mouth daily for 21 days.  ? losartan-hydrochlorothiazide (HYZAAR) 100-25 MG tablet Take 1 tablet by mouth daily.  ? Multiple Vitamins-Minerals (ONE-A-DAY WOMENS PO) Take 3 tablets by mouth daily. Gummy  ? rosuvastatin (CRESTOR) 20 MG tablet Take 1 tablet (20 mg total) by mouth daily.  ?  albuterol (PROVENTIL HFA;VENTOLIN HFA) 108 (90 Base) MCG/ACT inhaler Inhale 2 puffs into the lungs every 4 (four) hours as needed for wheezing or shortness of breath (cough, shortness of breath or wheezing.). (Patient not taking: Reported on 01/01/2022)  ? ?No facility-administered encounter medications on file as of 01/04/2022.  ? ? ? ?Review of Systems ? ?Review of Systems  ?Constitutional: Negative.   ?HENT: Negative.    ?Cardiovascular: Negative.   ?Gastrointestinal: Negative.   ?Allergic/Immunologic: Negative.   ?Neurological: Negative.   ?Psychiatric/Behavioral: Negative.     ? ? ? ?Physical Exam ? ?BP (!) 158/107   Pulse 61   Resp 18   Ht 5\' 8"  (1.727 m)   Wt 216 lb 4 oz (98.1 kg)   SpO2 94%   BMI 32.88 kg/m?  ? ?Wt Readings from Last 5 Encounters:  ?01/04/22 216 lb 4 oz (98.1 kg)  ?01/02/22 215 lb 9.8 oz (97.8 kg)  ?04/16/19 223 lb (101.2 kg)  ?08/30/17 217 lb (98.4 kg)  ?02/16/17 216 lb 6.4 oz (98.2 kg)  ? ? ? ?Physical Exam ?Vitals and nursing note reviewed.  ?Constitutional:   ?   General: She is not in acute distress. ?   Appearance: She is well-developed.  ?Cardiovascular:  ?   Rate and Rhythm: Normal rate. Rhythm irregular.  ?Pulmonary:  ?   Effort: Pulmonary effort is normal.  ?   Breath sounds: Normal breath sounds.  ?  Neurological:  ?   Mental Status: She is alert and oriented to person, place, and time.  ? ? ? ? ?Assessment & Plan:  ? ?TIA (transient ischemic attack) ?Continue plavix x3 weeks ? ?Continue aspirin ? ?2. Essential hypertension, benign ? ? ? ?3. Pure hypercholesterolemia ? ?Current Outpatient Medications on File Prior to Visit  ?Medication Sig Dispense Refill  ? amLODipine (NORVASC) 5 MG tablet Take 1 tablet (5 mg total) by mouth daily. 30 tablet 1  ? aspirin EC 81 MG EC tablet Take 1 tablet (81 mg total) by mouth daily. Swallow whole. 30 tablet 1  ? clopidogrel (PLAVIX) 75 MG tablet Take 1 tablet (75 mg total) by mouth daily for 21 days. 21 tablet 0  ? losartan-hydrochlorothiazide  (HYZAAR) 100-25 MG tablet Take 1 tablet by mouth daily. 30 tablet 1  ? Multiple Vitamins-Minerals (ONE-A-DAY WOMENS PO) Take 3 tablets by mouth daily. Gummy    ? rosuvastatin (CRESTOR) 20 MG tablet Take 1 tablet (20 mg total) by mouth daily. 30 tablet 1  ? albuterol (PROVENTIL HFA;VENTOLIN HFA) 108 (90 Base) MCG/ACT inhaler Inhale 2 puffs into the lungs every 4 (four) hours as needed for wheezing or shortness of breath (cough, shortness of breath or wheezing.). (Patient not taking: Reported on 01/01/2022) 1 Inhaler 1  ? ?No current facility-administered medications on file prior to visit.  ? ? ?Continue current medications ? ?Stay active - printout given with strengthening exercises ? ?Follow up: ? ?Follow up with Dr. Andrey Campanile in 4-6 weeks follow up TIA and HTN ? ? ? ? ?Ivonne Andrew, NP ?01/04/2022 ? ?

## 2022-01-04 NOTE — Patient Instructions (Signed)
1. TIA (transient ischemic attack) ? ?Continue plavix x3 weeks ? ?Continue aspirin ? ?2. Essential hypertension, benign ? ? ? ?3. Pure hypercholesterolemia ? ?Current Outpatient Medications on File Prior to Visit  ?Medication Sig Dispense Refill  ? amLODipine (NORVASC) 5 MG tablet Take 1 tablet (5 mg total) by mouth daily. 30 tablet 1  ? aspirin EC 81 MG EC tablet Take 1 tablet (81 mg total) by mouth daily. Swallow whole. 30 tablet 1  ? clopidogrel (PLAVIX) 75 MG tablet Take 1 tablet (75 mg total) by mouth daily for 21 days. 21 tablet 0  ? losartan-hydrochlorothiazide (HYZAAR) 100-25 MG tablet Take 1 tablet by mouth daily. 30 tablet 1  ? Multiple Vitamins-Minerals (ONE-A-DAY WOMENS PO) Take 3 tablets by mouth daily. Gummy    ? rosuvastatin (CRESTOR) 20 MG tablet Take 1 tablet (20 mg total) by mouth daily. 30 tablet 1  ? albuterol (PROVENTIL HFA;VENTOLIN HFA) 108 (90 Base) MCG/ACT inhaler Inhale 2 puffs into the lungs every 4 (four) hours as needed for wheezing or shortness of breath (cough, shortness of breath or wheezing.). (Patient not taking: Reported on 01/01/2022) 1 Inhaler 1  ? ?No current facility-administered medications on file prior to visit.  ? ? ?Continue current medications ? ?Stay active - printout given with strengthening exercises ? ?Follow up: ? ?Follow up with Dr. Andrey Campanile in 4-6 weeks follow up TIA and HTN ?

## 2022-01-04 NOTE — Assessment & Plan Note (Signed)
Continue plavix x3 weeks ? ?Continue aspirin ? ?2. Essential hypertension, benign ? ? ? ?3. Pure hypercholesterolemia ? ?Current Outpatient Medications on File Prior to Visit  ?Medication Sig Dispense Refill  ?? amLODipine (NORVASC) 5 MG tablet Take 1 tablet (5 mg total) by mouth daily. 30 tablet 1  ?? aspirin EC 81 MG EC tablet Take 1 tablet (81 mg total) by mouth daily. Swallow whole. 30 tablet 1  ?? clopidogrel (PLAVIX) 75 MG tablet Take 1 tablet (75 mg total) by mouth daily for 21 days. 21 tablet 0  ?? losartan-hydrochlorothiazide (HYZAAR) 100-25 MG tablet Take 1 tablet by mouth daily. 30 tablet 1  ?? Multiple Vitamins-Minerals (ONE-A-DAY WOMENS PO) Take 3 tablets by mouth daily. Gummy    ?? rosuvastatin (CRESTOR) 20 MG tablet Take 1 tablet (20 mg total) by mouth daily. 30 tablet 1  ?? albuterol (PROVENTIL HFA;VENTOLIN HFA) 108 (90 Base) MCG/ACT inhaler Inhale 2 puffs into the lungs every 4 (four) hours as needed for wheezing or shortness of breath (cough, shortness of breath or wheezing.). (Patient not taking: Reported on 01/01/2022) 1 Inhaler 1  ? ?No current facility-administered medications on file prior to visit.  ? ? ?Continue current medications ? ?Stay active - printout given with strengthening exercises ? ?Follow up: ? ?Follow up with Dr. Andrey Campanile in 4-6 weeks follow up TIA and HTN ?

## 2022-02-08 ENCOUNTER — Ambulatory Visit (INDEPENDENT_AMBULATORY_CARE_PROVIDER_SITE_OTHER): Payer: Medicare Other | Admitting: Family Medicine

## 2022-02-08 ENCOUNTER — Encounter: Payer: Self-pay | Admitting: Family Medicine

## 2022-02-08 VITALS — BP 163/93 | HR 66 | Temp 98.2°F | Resp 16 | Ht 67.0 in | Wt 214.8 lb

## 2022-02-08 DIAGNOSIS — E7800 Pure hypercholesterolemia, unspecified: Secondary | ICD-10-CM | POA: Diagnosis not present

## 2022-02-08 DIAGNOSIS — Z6833 Body mass index (BMI) 33.0-33.9, adult: Secondary | ICD-10-CM

## 2022-02-08 DIAGNOSIS — I10 Essential (primary) hypertension: Secondary | ICD-10-CM | POA: Diagnosis not present

## 2022-02-08 DIAGNOSIS — E669 Obesity, unspecified: Secondary | ICD-10-CM

## 2022-02-08 MED ORDER — AMLODIPINE BESYLATE 10 MG PO TABS: 10.0000 mg | ORAL_TABLET | Freq: Every day | ORAL | 0 refills | Status: DC

## 2022-02-08 MED ORDER — ROSUVASTATIN CALCIUM 20 MG PO TABS: 20.0000 mg | ORAL_TABLET | Freq: Every day | ORAL | 1 refills | Status: DC

## 2022-02-08 MED ORDER — LOSARTAN POTASSIUM-HCTZ 100-25 MG PO TABS: 1.0000 | ORAL_TABLET | Freq: Every day | ORAL | 1 refills | Status: DC

## 2022-02-08 NOTE — Progress Notes (Signed)
Patient said that she would like to get rid of her blood pressure medication, amlodipine. Patient said that she thinks she only need one medication for HTN. [name] ? ?

## 2022-02-08 NOTE — Progress Notes (Signed)
? ?Established Patient Office Visit ? ?Subjective   ? ?Patient ID: Meredith Randolph, female    DOB: Mar 16, 1956  Age: 66 y.o. MRN: [id number] ? ?CC:  ?Chief Complaint  ?Patient presents with  ? Follow-up  ? Hypertension  ? Transient Ischemic Attack  ? ? ?HPI ?Meredith Randolph presents for follow up of hypertension. Patient denies acute complaints or concerns.  ? ? ?Outpatient Encounter Medications as of 02/08/2022  ?Medication Sig  ? amLODipine (NORVASC) 5 MG tablet Take 1 tablet (5 mg total) by mouth daily.  ? aspirin EC 81 MG EC tablet Take 1 tablet (81 mg total) by mouth daily. Swallow whole.  ? losartan-hydrochlorothiazide (HYZAAR) 100-25 MG tablet Take 1 tablet by mouth daily.  ? Multiple Vitamins-Minerals (ONE-A-DAY WOMENS PO) Take 3 tablets by mouth daily. Gummy  ? rosuvastatin (CRESTOR) 20 MG tablet Take 1 tablet (20 mg total) by mouth daily.  ? [DISCONTINUED] albuterol (PROVENTIL HFA;VENTOLIN HFA) 108 (90 Base) MCG/ACT inhaler Inhale 2 puffs into the lungs every 4 (four) hours as needed for wheezing or shortness of breath (cough, shortness of breath or wheezing.). (Patient not taking: Reported on 01/01/2022)  ? ?No facility-administered encounter medications on file as of 02/08/2022.  ? ? ?Past Medical History:  ?Diagnosis Date  ? Hypertension   ? ? ?No past surgical history on file. ? ?Family History  ?Problem Relation Age of Onset  ? Lung cancer Father   ?     smoker  ? ? ?Social History  ? ?Socioeconomic History  ? Marital status: Married  ?  Spouse name: Not on file  ? Number of children: 1  ? Years of education: Not on file  ? Highest education level: Not on file  ?Occupational History  ? Occupation: retired  ?Tobacco Use  ? Smoking status: Former  ?  Packs/day: 1.00  ?  Years: 20.00  ?  Pack years: 20.00  ?  Types: Cigarettes  ?  Start date: 04/16/1979  ?  Quit date: 04/16/1999  ?  Years since quitting: 22.8  ? Smokeless tobacco: Never  ?Vaping Use  ? Vaping Use: Never used  ?Substance and Sexual Activity   ? Alcohol use: No  ? Drug use: No  ? Sexual activity: Not Currently  ?Other Topics Concern  ? Not on file  ?Social History Narrative  ? Employment: retired.  ? ?Social Determinants of Health  ? ?Financial Resource Strain: Not on file  ?Food Insecurity: Not on file  ?Transportation Needs: Not on file  ?Physical Activity: Not on file  ?Stress: Not on file  ?Social Connections: Not on file  ?Intimate Partner Violence: Not on file  ? ? ?Review of Systems  ?All other systems reviewed and are negative. ? ?  ? ? ?Objective   ? ?BP (!) 163/93   Pulse 66   Temp 98.2 ?F (36.8 ?C) (Oral)   Resp 16   Ht 5\' 7"  (1.702 m)   Wt 214 lb 12.8 oz (97.4 kg)   SpO2 98%   BMI 33.64 kg/m?  ? ?Physical Exam ?Vitals and nursing note reviewed.  ?Constitutional:   ?   General: She is not in acute distress. ?Cardiovascular:  ?   Rate and Rhythm: Normal rate and regular rhythm.  ?Pulmonary:  ?   Effort: Pulmonary effort is normal.  ?   Breath sounds: Normal breath sounds.  ?Abdominal:  ?   Palpations: Abdomen is soft.  ?   Tenderness: There is no abdominal tenderness.  ?Musculoskeletal:  ?  Right lower leg: No edema.  ?   Left lower leg: No edema.  ?Neurological:  ?   General: No focal deficit present.  ?   Mental Status: She is alert and oriented to person, place, and time.  ? ? ? ?  ? ?Assessment & Plan:  ? ?1. Uncontrolled hypertension ?Elevated readings. Will increase amlodipine from 5 mg to 10 mg and continue present regimen and monitor ?- losartan-hydrochlorothiazide (HYZAAR) 100-25 MG tablet; Take 1 tablet by mouth daily.  Dispense: 90 tablet; Refill: 1 ? ?2. Pure hypercholesterolemia ?Continue  ? ?3. Obesity, Class I, BMI 30-34.9 ?Discussed dietary and activity options. Goal is 3-5 lbs of wt loss/mo. Monitor  ? ? ? ?No follow-ups on file.  ? ?Becky Sax, MD ? ? ?

## 2022-02-10 ENCOUNTER — Encounter: Payer: Self-pay | Admitting: Family Medicine

## 2022-02-16 NOTE — Progress Notes (Deleted)
[facility] [address] [place]. [place] [address] [phone]       HOSPITAL FOLLOW UP NOTE  [name] Date of Birth:  10/02/1956 Medical Record Number:  [id number]   Reason for Referral:  hospital stroke follow up    SUBJECTIVE:   CHIEF COMPLAINT:  No chief complaint on file.   HPI:   Meredith Randolph is a 66 y.o. who  has a past medical history of Hypertension.  Patient presented on 01/01/2022 with concerns of intermittent lip numbness for the prior week and right lower weakness starting morning of visit. Right leg was dragging in the morning of 3/18 but seemed to improve some over about 30 minutes. She started having numbness of right hand later in the day and proceeded to ER. CT was negative for acute concerns but did show chronic mild to mod chronic small vessel ischemia. MRI showed possible old lacunar infarct but no acute concerns. She had been off HTN and HLD agents since Covid pandemic. She was started on dapt with asa 81 and Plavix for 3 weeks then asa alone. Rosuvastatin 20 resumed. Personally reviewed hospitalization pertinent progress notes, lab work and imaging.  Evaluated by Dr Erlinda Hong.   Since discharge home,      PERTINENT IMAGING/LABS  Code Stroke CT head: No acute intracranial abnormality. Mild to moderate chronic small vessel ischemia.  Punctate lacunar infarct in the anterior limb of the right internal capsule.  MRI: No acute intracranial abnormality. Moderate chronic microvascular ischemic disease. Small foci of susceptibility artifact and T2 hypointensity within the right basal ganglia and parieto-occipital region which probably represents prior microhemorrhages and/or small cavernous malformations.  MRA: No large vessel occlusion. Multifocal moderate left P2 PCA stenosis.  Motion limited (particularly proximally) without visible high-grade stenosis.  2D Echo EF 60 to 65%   A1C Lab Results  Component Value Date   HGBA1C 5.5  01/02/2022    Lipid Panel     Component Value Date/Time   CHOL 210 (H) 01/02/2022 0642   CHOL 244 (H) 04/16/2019 1210   TRIG 35 01/02/2022 0642   HDL 63 01/02/2022 0642   HDL 82 04/16/2019 1210   CHOLHDL 3.3 01/02/2022 0642   VLDL 7 01/02/2022 0642   LDLCALC 140 (H) 01/02/2022 0642   LDLCALC 152 (H) 04/16/2019 1210   LABVLDL 10 04/16/2019 1210      ROS:   14 system review of systems performed and negative with exception of those listed in HPI  PMH:  Past Medical History:  Diagnosis Date   Hypertension     PSH: No past surgical history on file.  Social History:  Social History   Socioeconomic History   Marital status: Married    Spouse name: Not on file   Number of children: 1   Years of education: Not on file   Highest education level: Not on file  Occupational History   Occupation: retired  Tobacco Use   Smoking status: Former    Packs/day: 1.00    Years: 20.00    Pack years: 20.00    Types: Cigarettes    Start date: 04/16/1979    Quit date: 04/16/1999    Years since quitting: 22.8   Smokeless tobacco: Never  Vaping Use   Vaping Use: Never used  Substance and Sexual Activity   Alcohol use: No   Drug use: No   Sexual activity: Not Currently  Other Topics Concern   Not on file  Social History Narrative  Employment: retired.   Social Determinants of Health   Financial Resource Strain: Not on file  Food Insecurity: Not on file  Transportation Needs: Not on file  Physical Activity: Not on file  Stress: Not on file  Social Connections: Not on file  Intimate Partner Violence: Not on file    Family History:  Family History  Problem Relation Age of Onset   Lung cancer Father        smoker    Medications:   Current Outpatient Medications on File Prior to Visit  Medication Sig Dispense Refill   amLODipine (NORVASC) 10 MG tablet Take 1 tablet (10 mg total) by mouth daily. 90 tablet 0   aspirin EC 81 MG EC tablet Take 1 tablet (81 mg total) by  mouth daily. Swallow whole. 30 tablet 1   losartan-hydrochlorothiazide (HYZAAR) 100-25 MG tablet Take 1 tablet by mouth daily. 90 tablet 1   Multiple Vitamins-Minerals (ONE-A-DAY WOMENS PO) Take 3 tablets by mouth daily. Gummy     rosuvastatin (CRESTOR) 20 MG tablet Take 1 tablet (20 mg total) by mouth daily. 90 tablet 1   No current facility-administered medications on file prior to visit.    Allergies:  No Known Allergies    OBJECTIVE:  Physical Exam  There were no vitals filed for this visit. There is no height or weight on file to calculate BMI. No results found.     02/08/2022    1:26 PM  Depression screen PHQ 2/9  Decreased Interest 0  Down, Depressed, Hopeless 0  PHQ - 2 Score 0  Altered sleeping 0  Tired, decreased energy 0  Change in appetite 0  Feeling bad or failure about yourself  0  Trouble concentrating 0  Moving slowly or fidgety/restless 0  Suicidal thoughts 0  PHQ-9 Score 0  Difficult doing work/chores Not difficult at all     General: well developed, well nourished, seated, in no evident distress Head: head normocephalic and atraumatic.   Neck: supple with no carotid or supraclavicular bruits Cardiovascular: regular rate and rhythm, no murmurs Musculoskeletal: no deformity Skin:  no rash/petichiae Vascular:  Normal pulses all extremities   Neurologic Exam Mental Status: Awake and fully alert.  Fluent speech and language.  Oriented to place and time. Recent and remote memory intact. Attention span, concentration and fund of knowledge appropriate. Mood and affect appropriate.  Cranial Nerves: Fundoscopic exam reveals sharp disc margins. Pupils equal, briskly reactive to light. Extraocular movements full without nystagmus. Visual fields full to confrontation. Hearing intact. Facial sensation intact. Face, tongue, palate moves normally and symmetrically.  Motor: Normal bulk and tone. Normal strength in all tested extremity muscles Sensory.: intact to  touch , pinprick , position and vibratory sensation.  Coordination: Rapid alternating movements normal in all extremities. Finger-to-nose and heel-to-shin performed accurately bilaterally. Gait and Station: Arises from chair without difficulty. Stance is normal. Gait demonstrates normal stride length and balance with ***. Tandem walk and heel toe ***.  Reflexes: 1+ and symmetric.    NIHSS  *** Modified Rankin  ***    ASSESSMENT: Meredith Randolph is a 66 y.o. year old female presented to the ER 01/01/2022 with worsening right lower extremity weakness and numbness of right fingers and lips. Vascular risk factors include HTN, HLD, former tobacco use,.   PLAN:  TIA : Residual deficit: none. Continue aspirin 81 mg daily  and rosuvastatin 20mg  daily  for secondary stroke prevention.  Discussed secondary stroke prevention measures and importance of close PCP  follow up for aggressive stroke risk factor management. I have gone over the pathophysiology of stroke, warning signs and symptoms, risk factors and their management in some detail with instructions to go to the closest emergency room for symptoms of concern. HTN: BP goal <130/90.  Stable on amlodipine 10mg  and losartan/hctz 100-25 daily per PCP HLD: LDL goal <70. Recent LDL 140.  DMII: A1c goal<7.0. Recent A1c 5.5.    Follow up in *** or call earlier if needed   CC:  [facility] provider: Dr. Leonie Man PCP: Dorna Mai, MD    I spent *** minutes of face-to-face and non-face-to-face time with patient.  This included previsit chart review including review of recent hospitalization, lab review, study review, order entry, electronic health record documentation, patient education regarding recent stroke including etiology, secondary stroke prevention measures and importance of managing stroke risk factors, residual deficits and typical recovery time and answered all other questions to patient satisfaction   Debbora Presto, [facility]  [facility] [address] [place], [place] [address]  Phone [phone] Fax [phone] Note: This document was prepared with digital dictation and possible smart phrase technology. Any transcriptional errors that result from this process are unintentional.

## 2022-02-16 NOTE — Patient Instructions (Incomplete)
Below is our plan:    Please make sure you are staying well hydrated. I recommend 50-60 ounces daily. Well balanced diet and regular exercise encouraged. Consistent sleep schedule with 6-8 hours recommended.   Please continue follow up with care team as directed.   Follow up with *** in ***  You may receive a survey regarding today's visit. I encourage you to leave honest feed back as I do use this information to improve patient care. Thank you for seeing me today!    

## 2022-02-21 ENCOUNTER — Inpatient Hospital Stay: Payer: Medicare Other | Admitting: Family Medicine

## 2022-02-21 DIAGNOSIS — G459 Transient cerebral ischemic attack, unspecified: Secondary | ICD-10-CM

## 2022-02-28 ENCOUNTER — Inpatient Hospital Stay: Payer: Medicare Other | Admitting: Family Medicine

## 2022-03-10 ENCOUNTER — Ambulatory Visit (INDEPENDENT_AMBULATORY_CARE_PROVIDER_SITE_OTHER): Payer: Medicare Other | Admitting: Family Medicine

## 2022-03-10 ENCOUNTER — Encounter: Payer: Self-pay | Admitting: Family Medicine

## 2022-03-10 VITALS — BP 144/91 | HR 65 | Temp 98.1°F | Resp 16 | Wt 212.0 lb

## 2022-03-10 DIAGNOSIS — E66811 Obesity, class 1: Secondary | ICD-10-CM

## 2022-03-10 DIAGNOSIS — E669 Obesity, unspecified: Secondary | ICD-10-CM

## 2022-03-10 DIAGNOSIS — I10 Essential (primary) hypertension: Secondary | ICD-10-CM | POA: Diagnosis not present

## 2022-03-10 DIAGNOSIS — E7800 Pure hypercholesterolemia, unspecified: Secondary | ICD-10-CM

## 2022-03-10 NOTE — Progress Notes (Signed)
F/up visit for hypertension.  Patient said she has no other concerns today for provider.

## 2022-03-10 NOTE — Progress Notes (Signed)
Established Patient Office Visit  Subjective    Patient ID: Meredith Randolph, female    DOB: 1955/12/23  Age: 66 y.o. MRN: [id number]  CC:  Chief Complaint  Patient presents with   Follow-up   Hypertension    HPI Meredith Randolph presents for routine follow up of hypertension. Patient denies acute complaints.    Outpatient Encounter Medications as of 03/10/2022  Medication Sig   amLODipine (NORVASC) 10 MG tablet Take 1 tablet (10 mg total) by mouth daily.   aspirin EC 81 MG EC tablet Take 1 tablet (81 mg total) by mouth daily. Swallow whole.   losartan-hydrochlorothiazide (HYZAAR) 100-25 MG tablet Take 1 tablet by mouth daily.   Multiple Vitamins-Minerals (ONE-A-DAY WOMENS PO) Take 3 tablets by mouth daily. Gummy   rosuvastatin (CRESTOR) 20 MG tablet Take 1 tablet (20 mg total) by mouth daily.   No facility-administered encounter medications on file as of 03/10/2022.    Past Medical History:  Diagnosis Date   Hypertension     History reviewed. No pertinent surgical history.  Family History  Problem Relation Age of Onset   Lung cancer Father        smoker    Social History   Socioeconomic History   Marital status: Married    Spouse name: Not on file   Number of children: 1   Years of education: Not on file   Highest education level: Not on file  Occupational History   Occupation: retired  Tobacco Use   Smoking status: Former    Packs/day: 1.00    Years: 20.00    Pack years: 20.00    Types: Cigarettes    Start date: 04/16/1979    Quit date: 04/16/1999    Years since quitting: 22.9   Smokeless tobacco: Never  Vaping Use   Vaping Use: Never used  Substance and Sexual Activity   Alcohol use: No   Drug use: No   Sexual activity: Not Currently  Other Topics Concern   Not on file  Social History Narrative   Employment: retired.   Social Determinants of Health   Financial Resource Strain: Not on file  Food Insecurity: Not on file  Transportation  Needs: Not on file  Physical Activity: Not on file  Stress: Not on file  Social Connections: Not on file  Intimate Partner Violence: Not on file    Review of Systems  All other systems reviewed and are negative.      Objective    BP (!) 144/91   Pulse 65   Temp 98.1 F (36.7 C) (Oral)   Resp 16   Wt 212 lb (96.2 kg)   SpO2 94%   BMI 33.20 kg/m   Physical Exam Vitals and nursing note reviewed.  Constitutional:      General: She is not in acute distress. Cardiovascular:     Rate and Rhythm: Normal rate and regular rhythm.  Pulmonary:     Effort: Pulmonary effort is normal.     Breath sounds: Normal breath sounds.  Abdominal:     Palpations: Abdomen is soft.     Tenderness: There is no abdominal tenderness.  Musculoskeletal:     Right lower leg: No edema.     Left lower leg: No edema.  Neurological:     General: No focal deficit present.     Mental Status: She is alert and oriented to person, place, and time.        Assessment & Plan:  1. Essential hypertension Readings minimally improved but patient defers any further change of agents. Will continue and monitor  2. Pure hypercholesterolemia Continue present management  3. Obesity, Class I, BMI 30-34.9 Discussed dietary and activity options.     Return in about 3 months (around 06/10/2022).   Becky Sax, MD

## 2022-04-05 ENCOUNTER — Encounter: Payer: Self-pay | Admitting: Family Medicine

## 2022-04-05 ENCOUNTER — Ambulatory Visit (INDEPENDENT_AMBULATORY_CARE_PROVIDER_SITE_OTHER): Payer: Medicare Other | Admitting: Family Medicine

## 2022-04-05 VITALS — BP 132/85 | HR 64 | Ht 66.0 in | Wt 209.0 lb

## 2022-04-05 DIAGNOSIS — G459 Transient cerebral ischemic attack, unspecified: Secondary | ICD-10-CM | POA: Diagnosis not present

## 2022-04-05 NOTE — Progress Notes (Signed)
[facility] [address] [place]. [place] [address] [phone]       HOSPITAL FOLLOW UP NOTE  [name] Date of Birth:  04/30/1956 Medical Record Number:  [id number]   Reason for Referral:  hospital stroke follow up  SUBJECTIVE:   CHIEF COMPLAINT:  Chief Complaint  Patient presents with   Hospitalization Follow-up    Rm 15. Alone. C/o mild weakness in right hand.    HPI:   Meredith Randolph is a 66 y.o. who  has a past medical history of Hypertension.  Patient presented on 01/01/2022 with intermittent right lower ext numbness and weakness for approximately a week prior with worsening the morning of 3/18. Per discharge note "MRI of the brain was negative for a CVA, an MRA was without any large vessel occlusions.  A 2D echo was done which showed an EF of 60-65%, no WMA, and had grade 2 diastolic dysfunction.  A1c was 5.5.  Lipid panel showed an LDL of 140, she was placed on a statin.  PT did not recommend any PT follow-up.  She is close to baseline, will be discharged home in stable condition, neurology recommends aspirin and Plavix for 3 weeks followed by aspirin alone." Personally reviewed hospitalization pertinent progress notes, lab work and imaging.  Evaluated by Roda Shutters.   Since discharge, she reports doing very well. She feels she is back to baseline. She may have some waxing and waning right hand weakness but this does not limit her functioning in any way. She remains active in her garden. She has seen PCP twice since discharge. BP has been well managed. She is tolerating rosuvastatin and continues asa 81mg . She admits that she probably consumes too much sodium and does not exercise. She does snore at times but denies concerns for apneic events.    PERTINENT IMAGING/LABS  Code Stroke CT head: No acute intracranial abnormality. Mild to moderate chronic small vessel ischemia.  Punctate lacunar infarct in the anterior limb of the right internal capsule.   MRI: No acute intracranial abnormality. Moderate chronic microvascular ischemic disease. Small foci of susceptibility artifact and T2 hypointensity within the right basal ganglia and parieto-occipital region which probably represents prior microhemorrhages and/or small cavernous malformations.  MRA: No large vessel occlusion. Multifocal moderate left P2 PCA stenosis.  Motion limited (particularly proximally) without visible high-grade stenosis.  2D Echo EF 60 to 65%   A1C Lab Results  Component Value Date   HGBA1C 5.5 01/02/2022    Lipid Panel     Component Value Date/Time   CHOL 210 (H) 01/02/2022 0642   CHOL 244 (H) 04/16/2019 1210   TRIG 35 01/02/2022 0642   HDL 63 01/02/2022 0642   HDL 82 04/16/2019 1210   CHOLHDL 3.3 01/02/2022 0642   VLDL 7 01/02/2022 0642   LDLCALC 140 (H) 01/02/2022 0642   LDLCALC 152 (H) 04/16/2019 1210   LABVLDL 10 04/16/2019 1210      ROS:   14 system review of systems performed and negative with exception of those listed in HPI  PMH:  Past Medical History:  Diagnosis Date   Hypertension     PSH: History reviewed. No pertinent surgical history.  Social History:  Social History   Socioeconomic History   Marital status: Married    Spouse name: Not on file   Number of children: 1   Years of education: Not on file   Highest education level: Not on file  Occupational History   Occupation: retired  Tobacco Use   Smoking status: Former    Packs/day: 1.00    Years: 20.00    Total pack years: 20.00    Types: Cigarettes    Start date: 04/16/1979    Quit date: 04/16/1999    Years since quitting: 22.9   Smokeless tobacco: Never  Vaping Use   Vaping Use: Never used  Substance and Sexual Activity   Alcohol use: No   Drug use: No   Sexual activity: Not Currently  Other Topics Concern   Not on file  Social History Narrative   Employment: retired.   Social Determinants of Health   Financial Resource Strain: Low Risk  (04/16/2019)    Overall Financial Resource Strain (CARDIA)    Difficulty of Paying Living Expenses: Not hard at all  Food Insecurity: No Food Insecurity (04/16/2019)   Hunger Vital Sign    Worried About Running Out of Food in the Last Year: Never true    Ran Out of Food in the Last Year: Never true  Transportation Needs: No Transportation Needs (04/16/2019)   PRAPARE - Administrator, Civil Service (Medical): No    Lack of Transportation (Non-Medical): No  Physical Activity: Sufficiently Active (04/16/2019)   Exercise Vital Sign    Days of Exercise per Week: 7 days    Minutes of Exercise per Session: 40 min  Stress: No Stress Concern Present (04/16/2019)   [employer] of Occupational Health - Occupational Stress Questionnaire    Feeling of Stress : Not at all  Social Connections: Unknown (04/16/2019)   Social Connection and Isolation Panel [NHANES]    Frequency of Communication with Friends and Family: More than three times a week    Frequency of Social Gatherings with Friends and Family: Three times a week    Attends Religious Services: Patient refused    Active Member of Clubs or Organizations: Patient refused    Attends Banker Meetings: Patient refused    Marital Status: Married  Catering manager Violence: Not At Risk (04/16/2019)   Humiliation, Afraid, Rape, and Kick questionnaire    Fear of Current or Ex-Partner: No    Emotionally Abused: No    Physically Abused: No    Sexually Abused: No    Family History:  Family History  Problem Relation Age of Onset   Lung cancer Father        smoker    Medications:   Current Outpatient Medications on File Prior to Visit  Medication Sig Dispense Refill   amLODipine (NORVASC) 10 MG tablet Take 1 tablet (10 mg total) by mouth daily. 90 tablet 0   aspirin EC 81 MG EC tablet Take 1 tablet (81 mg total) by mouth daily. Swallow whole. 30 tablet 1   losartan-hydrochlorothiazide (HYZAAR) 100-25 MG tablet Take 1 tablet by mouth  daily. 90 tablet 1   Multiple Vitamins-Minerals (ONE-A-DAY WOMENS PO) Take 3 tablets by mouth daily. Gummy     rosuvastatin (CRESTOR) 20 MG tablet Take 1 tablet (20 mg total) by mouth daily. 90 tablet 1   No current facility-administered medications on file prior to visit.    Allergies:  No Known Allergies    OBJECTIVE:  Physical Exam  Vitals:   04/05/22 0919  BP: 132/85  Pulse: 64  Weight: 209 lb (94.8 kg)  Height: 5\' 6"  (1.676 m)   Body mass index is 33.73 kg/m. No results found.     03/10/2022    8:08 AM  Depression screen PHQ 2/9  Decreased Interest 0  Down, Depressed, Hopeless 0  PHQ - 2 Score 0  Altered sleeping 0  Tired, decreased energy 0  Change in appetite 0  Feeling bad or failure about yourself  0  Trouble concentrating 0  Moving slowly or fidgety/restless 0  Suicidal thoughts 0  PHQ-9 Score 0  Difficult doing work/chores Not difficult at all     General: well developed, well nourished, seated, in no evident distress Head: head normocephalic and atraumatic.   Neck: supple with no carotid or supraclavicular bruits Cardiovascular: regular rate and rhythm, no murmurs Musculoskeletal: no deformity Skin:  no rash/petichiae Vascular:  Normal pulses all extremities   Neurologic Exam Mental Status: Awake and fully alert.  Fluent speech and language.  Oriented to place and time. Recent and remote memory intact. Attention span, concentration and fund of knowledge appropriate. Mood and affect appropriate.  Cranial Nerves: Fundoscopic exam reveals sharp disc margins. Pupils equal, briskly reactive to light. Extraocular movements full without nystagmus. Visual fields full to confrontation. Hearing intact. Facial sensation intact. Face, tongue, palate moves normally and symmetrically.  Motor: Normal bulk and tone. Normal strength in all tested extremity muscles Sensory.: intact to touch , pinprick , position and vibratory sensation.  Coordination: Rapid  alternating movements normal in all extremities. Finger-to-nose and heel-to-shin performed accurately bilaterally. Gait and Station: Arises from chair without difficulty. Stance is normal. Gait demonstrates normal stride length and balance with no assistive device.  Reflexes: 1+ and symmetric.    NIHSS  0 Modified Rankin  0    ASSESSMENT: Meredith Randolph is a 66 y.o. year old female presenting to the ER 01/01/2022 with intermittent right lower ext weakness and numbness. Vascular risk factors include HTN, HLD, advanced age, former smoker,and obesity.    PLAN:  TIA : Residual deficit: none. Continue aspirin 81 mg daily  and rosuvastatin 20mg  daily for secondary stroke prevention.  Discussed secondary stroke prevention measures and importance of close PCP follow up for aggressive stroke risk factor management. I have gone over the pathophysiology of stroke, warning signs and symptoms, risk factors and their management in some detail with instructions to go to the closest emergency room for symptoms of concern. HTN: BP goal <130/90.  Stable on amlodipine 10mg  and losartan-HCTZ 100-25mg  daily per PCP HLD: LDL goal <70. Recent LDL 140. Continue rosuvastatin  DMII: A1c goal<7.0. Recent A1c 5.5.  Obesity: continue working on healthy lifestyle habits with well balanced meals and regular exercise.    Follow up as needed or call earlier if needed   CC:  [facility] provider: Dr. PCP: , MD    I spent 45 minutes of face-to-face and non-face-to-face time with patient.  This included previsit chart review including review of recent hospitalization, lab review, study review, order entry, electronic health record documentation, patient education regarding recent stroke including etiology, secondary stroke prevention measures and importance of managing stroke risk factors, residual deficits and typical recovery time and answered all other questions to patient satisfaction   Pearlean Brownie,  [facility]  [facility] [address] [place], [address] [place]  Phone [phone] Fax [phone] Note: This document was prepared with digital dictation and possible smart phrase technology. Any transcriptional errors that result from this process are unintentional.

## 2022-04-05 NOTE — Patient Instructions (Signed)
Below is our plan:  TIA : Residual deficit: none. Continue aspirin 81 mg daily  and rosuvastatin 20mg  daily for secondary stroke prevention.  Discussed secondary stroke prevention measures and importance of close PCP follow up for aggressive stroke risk factor management. I have gone over the pathophysiology of stroke, warning signs and symptoms, risk factors and their management in some detail with instructions to go to the closest emergency room for symptoms of concern. HTN: BP goal <130/90.  Stable on amlodipine 10mg  and losartan-HCTZ 100-25mg  daily per PCP HLD: LDL goal <70. Recent LDL 140. Continue rosuvastatin  DMII: A1c goal<7.0. Recent A1c 5.5. Please continue to monitor weight.  Obesity: continue working on healthy lifestyle habits with well balanced meals and regular exercise.   Please make sure you are staying well hydrated. I recommend 50-60 ounces daily. Well balanced diet and regular exercise encouraged. Consistent sleep schedule with 6-8 hours recommended.   Please continue follow up with care team as directed.   Follow up with me as needed   You may receive a survey regarding today's visit. I encourage you to leave honest feed back as I do use this information to improve patient care. Thank you for seeing me today!

## 2022-06-06 ENCOUNTER — Ambulatory Visit (INDEPENDENT_AMBULATORY_CARE_PROVIDER_SITE_OTHER): Payer: Medicare Other | Admitting: Family Medicine

## 2022-06-06 ENCOUNTER — Encounter: Payer: Self-pay | Admitting: Family Medicine

## 2022-06-06 VITALS — BP 126/81 | HR 60 | Temp 98.1°F | Resp 16 | Ht 67.0 in | Wt 207.0 lb

## 2022-06-06 DIAGNOSIS — E669 Obesity, unspecified: Secondary | ICD-10-CM | POA: Diagnosis not present

## 2022-06-06 DIAGNOSIS — E66811 Obesity, class 1: Secondary | ICD-10-CM

## 2022-06-06 DIAGNOSIS — E7800 Pure hypercholesterolemia, unspecified: Secondary | ICD-10-CM

## 2022-06-06 DIAGNOSIS — I10 Essential (primary) hypertension: Secondary | ICD-10-CM | POA: Diagnosis not present

## 2022-06-06 MED ORDER — AMLODIPINE BESYLATE 10 MG PO TABS: 10.0000 mg | ORAL_TABLET | Freq: Every day | ORAL | 1 refills | Status: DC

## 2022-06-06 MED ORDER — AMLODIPINE BESYLATE 5 MG PO TABS: 5.0000 mg | ORAL_TABLET | Freq: Every day | ORAL | 1 refills | Status: DC

## 2022-06-06 NOTE — Progress Notes (Signed)
Patient is here for regular 3 month f/u with know concerns . Patient said she is doing very well.

## 2022-06-06 NOTE — Progress Notes (Unsigned)
Established Patient Office Visit  Subjective    Patient ID: Meredith Randolph, female    DOB: 12-04-1955  Age: 66 y.o. MRN: [id number]  CC:  Chief Complaint  Patient presents with   Hypertension    HPI BRIANY Randolph presents for follow up of chronic med issues including hypertension. Patient reports that she has been taking only 5 mg of amlodipine for the past month and a half and would like to decrease the dosage if her readings are good.    Outpatient Encounter Medications as of 06/06/2022  Medication Sig   aspirin EC 81 MG EC tablet Take 1 tablet (81 mg total) by mouth daily. Swallow whole.   losartan-hydrochlorothiazide (HYZAAR) 100-25 MG tablet Take 1 tablet by mouth daily.   Multiple Vitamins-Minerals (ONE-A-DAY WOMENS PO) Take 3 tablets by mouth daily. Gummy   rosuvastatin (CRESTOR) 20 MG tablet Take 1 tablet (20 mg total) by mouth daily.   [DISCONTINUED] amLODipine (NORVASC) 10 MG tablet Take 1 tablet (10 mg total) by mouth daily.   [DISCONTINUED] amLODipine (NORVASC) 10 MG tablet Take 1 tablet (10 mg total) by mouth daily.   No facility-administered encounter medications on file as of 06/06/2022.    Past Medical History:  Diagnosis Date   Hypertension     No past surgical history on file.  Family History  Problem Relation Age of Onset   Lung cancer Father        smoker    Social History   Socioeconomic History   Marital status: Married    Spouse name: Not on file   Number of children: 1   Years of education: Not on file   Highest education level: Not on file  Occupational History   Occupation: retired  Tobacco Use   Smoking status: Former    Packs/day: 1.00    Years: 20.00    Total pack years: 20.00    Types: Cigarettes    Start date: 04/16/1979    Quit date: 04/16/1999    Years since quitting: 23.1   Smokeless tobacco: Never  Vaping Use   Vaping Use: Never used  Substance and Sexual Activity   Alcohol use: No   Drug use: No   Sexual  activity: Not Currently  Other Topics Concern   Not on file  Social History Narrative   Employment: retired.   Social Determinants of Health   Financial Resource Strain: Low Risk  (04/16/2019)   Overall Financial Resource Strain (CARDIA)    Difficulty of Paying Living Expenses: Not hard at all  Food Insecurity: No Food Insecurity (04/16/2019)   Hunger Vital Sign    Worried About Running Out of Food in the Last Year: Never true    Ran Out of Food in the Last Year: Never true  Transportation Needs: No Transportation Needs (04/16/2019)   PRAPARE - Administrator, Civil Service (Medical): No    Lack of Transportation (Non-Medical): No  Physical Activity: Sufficiently Active (04/16/2019)   Exercise Vital Sign    Days of Exercise per Week: 7 days    Minutes of Exercise per Session: 40 min  Stress: No Stress Concern Present (04/16/2019)   [employer] of Occupational Health - Occupational Stress Questionnaire    Feeling of Stress : Not at all  Social Connections: Unknown (04/16/2019)   Social Connection and Isolation Panel [NHANES]    Frequency of Communication with Friends and Family: More than three times a week    Frequency of Social Gatherings  with Friends and Family: Three times a week    Attends Religious Services: Patient refused    Active Member of Clubs or Organizations: Patient refused    Attends Banker Meetings: Patient refused    Marital Status: Married  Catering manager Violence: Not At Risk (04/16/2019)   Humiliation, Afraid, Rape, and Kick questionnaire    Fear of Current or Ex-Partner: No    Emotionally Abused: No    Physically Abused: No    Sexually Abused: No    Review of Systems  All other systems reviewed and are negative.       Objective    BP 126/81   Pulse 60   Temp 98.1 F (36.7 C) (Oral)   Resp 16   Ht 5\' 7"  (1.702 m)   Wt 207 lb (93.9 kg)   SpO2 93%   BMI 32.42 kg/m   Physical Exam Vitals and nursing note  reviewed.  Constitutional:      General: She is not in acute distress. Cardiovascular:     Rate and Rhythm: Normal rate and regular rhythm.  Pulmonary:     Effort: Pulmonary effort is normal.     Breath sounds: Normal breath sounds.  Abdominal:     Palpations: Abdomen is soft.     Tenderness: There is no abdominal tenderness.  Musculoskeletal:     Right lower leg: No edema.     Left lower leg: No edema.  Neurological:     General: No focal deficit present.     Mental Status: She is alert and oriented to person, place, and time.     {Labs (Optional):23779}    Assessment & Plan:   1. Essential hypertension ***  2. Pure hypercholesterolemia ***  3. Obesity, Class I, BMI 30-34.9 ***    Return in about 6 months (around 12/07/2022) for follow up hypertension, physical.   12/09/2022, MD

## 2022-06-07 ENCOUNTER — Encounter: Payer: Self-pay | Admitting: Family Medicine

## 2022-10-26 ENCOUNTER — Ambulatory Visit (INDEPENDENT_AMBULATORY_CARE_PROVIDER_SITE_OTHER): Payer: Medicare Other

## 2022-10-26 VITALS — Ht 66.0 in | Wt 209.0 lb

## 2022-10-26 DIAGNOSIS — Z0000 Encounter for general adult medical examination without abnormal findings: Secondary | ICD-10-CM

## 2022-10-26 NOTE — Patient Instructions (Signed)
Meredith Randolph , Thank you for taking time to come for your Medicare Wellness Visit. I appreciate your ongoing commitment to your health goals. Please review the following plan we discussed and let me know if I can assist you in the future.   These are the goals we discussed:  Goals      Patient Stated     10/26/2022, wants to lose weight        This is a list of the screening recommended for you and due dates:  Health Maintenance  Topic Date Due   COVID-19 Vaccine (1) Never done   DTaP/Tdap/Td vaccine (1 - Tdap) Never done   Pneumonia Vaccine (1 - PCV) Never done   DEXA scan (bone density measurement)  Never done   Flu Shot  05/17/2022   Mammogram  01/05/2023*   Colon Cancer Screening  01/05/2023*   Medicare Annual Wellness Visit  10/27/2023   Hepatitis C Screening: USPSTF Recommendation to screen - Ages 18-79 yo.  Completed   HPV Vaccine  Aged Out   Zoster (Shingles) Vaccine  Discontinued  *Topic was postponed. The date shown is not the original due date.    Advanced directives: Advance directive discussed with you today.   Conditions/risks identified: none  Next appointment: Follow up in one year for your annual wellness visit    Preventive Care 65 Years and Older, Female Preventive care refers to lifestyle choices and visits with your health care provider that can promote health and wellness. What does preventive care include? A yearly physical exam. This is also called an annual well check. Dental exams once or twice a year. Routine eye exams. Ask your health care provider how often you should have your eyes checked. Personal lifestyle choices, including: Daily care of your teeth and gums. Regular physical activity. Eating a healthy diet. Avoiding tobacco and drug use. Limiting alcohol use. Practicing safe sex. Taking low-dose aspirin every day. Taking vitamin and mineral supplements as recommended by your health care provider. What happens during an annual well  check? The services and screenings done by your health care provider during your annual well check will depend on your age, overall health, lifestyle risk factors, and family history of disease. Counseling  Your health care provider may ask you questions about your: Alcohol use. Tobacco use. Drug use. Emotional well-being. Home and relationship well-being. Sexual activity. Eating habits. History of falls. Memory and ability to understand (cognition). Work and work Statistician. Reproductive health. Screening  You may have the following tests or measurements: Height, weight, and BMI. Blood pressure. Lipid and cholesterol levels. These may be checked every 5 years, or more frequently if you are over 64 years old. Skin check. Lung cancer screening. You may have this screening every year starting at age 74 if you have a 30-pack-year history of smoking and currently smoke or have quit within the past 15 years. Fecal occult blood test (FOBT) of the stool. You may have this test every year starting at age 64. Flexible sigmoidoscopy or colonoscopy. You may have a sigmoidoscopy every 5 years or a colonoscopy every 10 years starting at age 58. Hepatitis C blood test. Hepatitis B blood test. Sexually transmitted disease (STD) testing. Diabetes screening. This is done by checking your blood sugar (glucose) after you have not eaten for a while (fasting). You may have this done every 1-3 years. Bone density scan. This is done to screen for osteoporosis. You may have this done starting at age 68. Mammogram. This may  be done every 1-2 years. Talk to your health care provider about how often you should have regular mammograms. Talk with your health care provider about your test results, treatment options, and if necessary, the need for more tests. Vaccines  Your health care provider may recommend certain vaccines, such as: Influenza vaccine. This is recommended every year. Tetanus, diphtheria, and  acellular pertussis (Tdap, Td) vaccine. You may need a Td booster every 10 years. Zoster vaccine. You may need this after age 6. Pneumococcal 13-valent conjugate (PCV13) vaccine. One dose is recommended after age 72. Pneumococcal polysaccharide (PPSV23) vaccine. One dose is recommended after age 54. Talk to your health care provider about which screenings and vaccines you need and how often you need them. This information is not intended to replace advice given to you by your health care provider. Make sure you discuss any questions you have with your health care provider. Document Released: 10/30/2015 Document Revised: 06/22/2016 Document Reviewed: 08/04/2015 Elsevier Interactive Patient Education  2017 [place] Prevention in the Home Falls can cause injuries. They can happen to people of all ages. There are many things you can do to make your home safe and to help prevent falls. What can I do on the outside of my home? Regularly fix the edges of walkways and driveways and fix any cracks. Remove anything that might make you trip as you walk through a door, such as a raised step or threshold. Trim any bushes or trees on the path to your home. Use bright outdoor lighting. Clear any walking paths of anything that might make someone trip, such as rocks or tools. Regularly check to see if handrails are loose or broken. Make sure that both sides of any steps have handrails. Any raised decks and porches should have guardrails on the edges. Have any leaves, snow, or ice cleared regularly. Use sand or salt on walking paths during winter. Clean up any spills in your garage right away. This includes oil or grease spills. What can I do in the bathroom? Use night lights. Install grab bars by the toilet and in the tub and shower. Do not use towel bars as grab bars. Use non-skid mats or decals in the tub or shower. If you need to sit down in the shower, use a plastic, non-slip stool. Keep  the floor dry. Clean up any water that spills on the floor as soon as it happens. Remove soap buildup in the tub or shower regularly. Attach bath mats securely with double-sided non-slip rug tape. Do not have throw rugs and other things on the floor that can make you trip. What can I do in the bedroom? Use night lights. Make sure that you have a light by your bed that is easy to reach. Do not use any sheets or blankets that are too big for your bed. They should not hang down onto the floor. Have a firm chair that has side arms. You can use this for support while you get dressed. Do not have throw rugs and other things on the floor that can make you trip. What can I do in the kitchen? Clean up any spills right away. Avoid walking on wet floors. Keep items that you use a lot in easy-to-reach places. If you need to reach something above you, use a strong step stool that has a grab bar. Keep electrical cords out of the way. Do not use floor polish or wax that makes floors slippery. If you must use  wax, use non-skid floor wax. Do not have throw rugs and other things on the floor that can make you trip. What can I do with my stairs? Do not leave any items on the stairs. Make sure that there are handrails on both sides of the stairs and use them. Fix handrails that are broken or loose. Make sure that handrails are as long as the stairways. Check any carpeting to make sure that it is firmly attached to the stairs. Fix any carpet that is loose or worn. Avoid having throw rugs at the top or bottom of the stairs. If you do have throw rugs, attach them to the floor with carpet tape. Make sure that you have a light switch at the top of the stairs and the bottom of the stairs. If you do not have them, ask someone to add them for you. What else can I do to help prevent falls? Wear shoes that: Do not have high heels. Have rubber bottoms. Are comfortable and fit you well. Are closed at the toe. Do not  wear sandals. If you use a stepladder: Make sure that it is fully opened. Do not climb a closed stepladder. Make sure that both sides of the stepladder are locked into place. Ask someone to hold it for you, if possible. Clearly mark and make sure that you can see: Any grab bars or handrails. First and last steps. Where the edge of each step is. Use tools that help you move around (mobility aids) if they are needed. These include: Canes. Walkers. Scooters. Crutches. Turn on the lights when you go into a dark area. Replace any light bulbs as soon as they burn out. Set up your furniture so you have a clear path. Avoid moving your furniture around. If any of your floors are uneven, fix them. If there are any pets around you, be aware of where they are. Review your medicines with your doctor. Some medicines can make you feel dizzy. This can increase your chance of falling. Ask your doctor what other things that you can do to help prevent falls. This information is not intended to replace advice given to you by your health care provider. Make sure you discuss any questions you have with your health care provider. Document Released: 07/30/2009 Document Revised: 03/10/2016 Document Reviewed: 11/07/2014 Elsevier Interactive Patient Education  2017 [employer].

## 2022-10-26 NOTE — Progress Notes (Signed)
I connected with Meredith Randolph today by telephone and verified that I am speaking with the correct person using two identifiers. Location patient: home Location provider: work Persons participating in the virtual visit: Milca Sytsma, Glenna Durand LPN.   I discussed the limitations, risks, security and privacy concerns of performing an evaluation and management service by telephone and the availability of in person appointments. I also discussed with the patient that there may be a patient responsible charge related to this service. The patient expressed understanding and verbally consented to this telephonic visit.    Interactive audio and video telecommunications were attempted between this provider and patient, however failed, due to patient having technical difficulties OR patient did not have access to video capability.  We continued and completed visit with audio only.     Vital signs may be patient reported or missing.  Subjective:   Meredith Randolph is a 67 y.o. female who presents for an Initial Medicare Annual Wellness Visit.  Review of Systems     Cardiac Risk Factors include: advanced age (>51men, >41 women);dyslipidemia;hypertension;obesity (BMI >30kg/m2)     Objective:    Today's Vitals   10/26/22 0811  Weight: 209 lb (94.8 kg)  Height: 5\' 6"  (1.676 m)   Body mass index is 33.73 kg/m.     10/26/2022    8:14 AM 01/02/2022    4:39 AM 01/01/2022    8:04 PM 01/07/2016    1:43 AM  Advanced Directives  Does Patient Have a Medical Advance Directive? No No No No  Would patient like information on creating a medical advance directive?  No - Patient declined      Current Medications (verified) Outpatient Encounter Medications as of 10/26/2022  Medication Sig   amLODipine (NORVASC) 5 MG tablet Take 1 tablet (5 mg total) by mouth daily.   aspirin EC 81 MG EC tablet Take 1 tablet (81 mg total) by mouth daily. Swallow whole.   losartan-hydrochlorothiazide (HYZAAR)  100-25 MG tablet Take 1 tablet by mouth daily.   Multiple Vitamins-Minerals (ONE-A-DAY WOMENS PO) Take 3 tablets by mouth daily. Gummy   rosuvastatin (CRESTOR) 20 MG tablet Take 1 tablet (20 mg total) by mouth daily.   No facility-administered encounter medications on file as of 10/26/2022.    Allergies (verified) Patient has no known allergies.   History: Past Medical History:  Diagnosis Date   Hypertension    History reviewed. No pertinent surgical history. Family History  Problem Relation Age of Onset   Lung cancer Father        smoker   Social History   Socioeconomic History   Marital status: Married    Spouse name: Not on file   Number of children: 1   Years of education: Not on file   Highest education level: Not on file  Occupational History   Occupation: retired  Tobacco Use   Smoking status: Former    Packs/day: 1.00    Years: 20.00    Total pack years: 20.00    Types: Cigarettes    Start date: 04/16/1979    Quit date: 04/16/1999    Years since quitting: 23.5   Smokeless tobacco: Never  Vaping Use   Vaping Use: Never used  Substance and Sexual Activity   Alcohol use: No   Drug use: No   Sexual activity: Not Currently  Other Topics Concern   Not on file  Social History Narrative   Employment: retired.   Social Determinants of Radio broadcast assistant  Strain: Low Risk  (10/26/2022)   Overall Financial Resource Strain (CARDIA)    Difficulty of Paying Living Expenses: Not hard at all  Food Insecurity: No Food Insecurity (10/26/2022)   Hunger Vital Sign    Worried About Running Out of Food in the Last Year: Never true    Ran Out of Food in the Last Year: Never true  Transportation Needs: No Transportation Needs (10/26/2022)   PRAPARE - Administrator, Civil Service (Medical): No    Lack of Transportation (Non-Medical): No  Physical Activity: Sufficiently Active (10/26/2022)   Exercise Vital Sign    Days of Exercise per Week: 5 days     Minutes of Exercise per Session: 30 min  Stress: No Stress Concern Present (10/26/2022)   [employer] of Occupational Health - Occupational Stress Questionnaire    Feeling of Stress : Not at all  Social Connections: Unknown (04/16/2019)   Social Connection and Isolation Panel [NHANES]    Frequency of Communication with Friends and Family: More than three times a week    Frequency of Social Gatherings with Friends and Family: Three times a week    Attends Religious Services: Patient refused    Active Member of Clubs or Organizations: Patient refused    Attends Banker Meetings: Patient refused    Marital Status: Married    Tobacco Counseling Counseling given: Not Answered   Clinical Intake:  Pre-visit preparation completed: Yes  Pain : No/denies pain     Nutritional Status: BMI > 30  Obese Nutritional Risks: None Diabetes: No  How often do you need to have someone help you when you read instructions, pamphlets, or other written materials from your doctor or pharmacy?: 1 - Never  Diabetic? no  Interpreter Needed?: No  Information entered by :: NAllen LPN   Activities of Daily Living    10/26/2022    8:14 AM 01/02/2022    4:39 AM  In your present state of health, do you have any difficulty performing the following activities:  Hearing? 0 0  Vision? 0 0  Difficulty concentrating or making decisions? 0 0  Walking or climbing stairs? 0 0  Dressing or bathing? 0 0  Doing errands, shopping? 0 0  Preparing Food and eating ? N   Using the Toilet? N   In the past six months, have you accidently leaked urine? Y   Do you have problems with loss of bowel control? N   Managing your Medications? N   Managing your Finances? N   Housekeeping or managing your Housekeeping? N     Patient Care Team: Georganna Skeans, MD as PCP - General (Family Medicine)  Indicate any recent Medical Services you may have received from other than [facility] providers in the past year  (date may be approximate).     Assessment:   This is a routine wellness examination for Meredith Randolph.  Hearing/Vision screen Vision Screening - Comments:: Regular eye exams, Dr. Dierdre Searles  Dietary issues and exercise activities discussed: Current Exercise Habits: Home exercise routine, Type of exercise: walking, Time (Minutes): 30, Frequency (Times/Week): 5, Weekly Exercise (Minutes/Week): 150   Goals Addressed             This Visit's Progress    Patient Stated       10/26/2022, wants to lose weight       Depression Screen    10/26/2022    8:14 AM 06/06/2022    8:26 AM 03/10/2022    8:08 AM  02/08/2022    1:26 PM 01/04/2022   11:30 AM 04/16/2019   11:15 AM 04/16/2019   11:00 AM  PHQ 2/9 Scores  PHQ - 2 Score 0 0 0 0 0 0 0  PHQ- 9 Score  0 0 0 0      Fall Risk    10/26/2022    8:14 AM 01/04/2022   10:29 AM 04/16/2019   11:15 AM 04/16/2019   11:00 AM 08/30/2017    2:12 PM  Fall Risk   Falls in the past year? 0 0 0 0 No  Number falls in past yr: 0 0     Injury with Fall? 0 0 0 0   Risk for fall due to : Medication side effect No Fall Risks     Follow up Falls prevention discussed;Education provided;Falls evaluation completed        FALL RISK PREVENTION PERTAINING TO THE HOME:  Any stairs in or around the home? Yes  If so, are there any without handrails? No  Home free of loose throw rugs in walkways, pet beds, electrical cords, etc? Yes  Adequate lighting in your home to reduce risk of falls? Yes   ASSISTIVE DEVICES UTILIZED TO PREVENT FALLS:  Life alert? No  Use of a cane, walker or w/c? No  Grab bars in the bathroom? Yes  Shower chair or bench in shower? Yes  Elevated toilet seat or a handicapped toilet? No   TIMED UP AND GO:  Was the test performed? No .       Cognitive Function:        10/26/2022    8:15 AM  6CIT Screen  What Year? 0 points  What month? 0 points  What time? 0 points  Count back from 20 0 points  Months in reverse 0 points  Repeat  phrase 0 points  Total Score 0 points    Immunizations Immunization History  Administered Date(s) Administered   Influenza-Unspecified 07/05/2019, 08/12/2020    TDAP status: Due, Education has been provided regarding the importance of this vaccine. Advised may receive this vaccine at local pharmacy or Health Dept. Aware to provide a copy of the vaccination record if obtained from local pharmacy or Health Dept. Verbalized acceptance and understanding.  Flu Vaccine status: Due, Education has been provided regarding the importance of this vaccine. Advised may receive this vaccine at local pharmacy or Health Dept. Aware to provide a copy of the vaccination record if obtained from local pharmacy or Health Dept. Verbalized acceptance and understanding.  Pneumococcal vaccine status: Due, Education has been provided regarding the importance of this vaccine. Advised may receive this vaccine at local pharmacy or Health Dept. Aware to provide a copy of the vaccination record if obtained from local pharmacy or Health Dept. Verbalized acceptance and understanding.  Covid-19 vaccine status: Completed vaccines  Qualifies for Shingles Vaccine? Yes   Zostavax completed No   Shingrix Completed?: No.    Education has been provided regarding the importance of this vaccine. Patient has been advised to call insurance company to determine out of pocket expense if they have not yet received this vaccine. Advised may also receive vaccine at local pharmacy or Health Dept. Verbalized acceptance and understanding.  Screening Tests Health Maintenance  Topic Date Due   Medicare Annual Wellness (AWV)  Never done   COVID-19 Vaccine (1) Never done   DTaP/Tdap/Td (1 - Tdap) Never done   Pneumonia Vaccine 78+ Years old (1 - PCV) Never done   DEXA  SCAN  Never done   INFLUENZA VACCINE  05/17/2022   MAMMOGRAM  01/05/2023 (Originally 06/21/2005)   COLONOSCOPY (Pts 45-61yrs Insurance coverage will need to be confirmed)   01/05/2023 (Originally 06/21/2000)   Hepatitis C Screening  Completed   HPV VACCINES  Aged Out   Zoster Vaccines- Shingrix  Discontinued    Health Maintenance  Health Maintenance Due  Topic Date Due   Medicare Annual Wellness (AWV)  Never done   COVID-19 Vaccine (1) Never done   DTaP/Tdap/Td (1 - Tdap) Never done   Pneumonia Vaccine 63+ Years old (1 - PCV) Never done   DEXA SCAN  Never done   INFLUENZA VACCINE  05/17/2022    Colorectal cancer screening: decline  Mammogram status: decline  Bone Density status: due  Lung Cancer Screening: (Low Dose CT Chest recommended if Age 25-80 years, 30 pack-year currently smoking OR have quit w/in 15years.) does not qualify.   Lung Cancer Screening Referral: no  Additional Screening:  Hepatitis C Screening: does qualify; Completed 05/09/2014  Vision Screening: Recommended annual ophthalmology exams for early detection of glaucoma and other disorders of the eye. Is the patient up to date with their annual eye exam?  Yes  Who is the provider or what is the name of the office in which the patient attends annual eye exams? Dr. Nicoletta Dress If pt is not established with a provider, would they like to be referred to a provider to establish care? No .   Dental Screening: Recommended annual dental exams for proper oral hygiene  Community Resource Referral / Chronic Care Management: CRR required this visit?  No   CCM required this visit?  No      Plan:     I have personally reviewed and noted the following in the patient's chart:   Medical and social history Use of alcohol, tobacco or illicit drugs  Current medications and supplements including opioid prescriptions. Patient is not currently taking opioid prescriptions. Functional ability and status Nutritional status Physical activity Advanced directives List of other physicians Hospitalizations, surgeries, and ER visits in previous 12 months Vitals Screenings to include cognitive,  depression, and falls Referrals and appointments  In addition, I have reviewed and discussed with patient certain preventive protocols, quality metrics, and best practice recommendations. A written personalized care plan for preventive services as well as general preventive health recommendations were provided to patient.     Kellie Simmering, LPN   8/67/6720   Nurse Notes: none  Due to this being a virtual visit, the after visit summary with patients personalized plan was offered to patient via mail or my-chart.  to pick up at office at next visit

## 2022-12-01 ENCOUNTER — Other Ambulatory Visit: Payer: Self-pay | Admitting: Family Medicine

## 2022-12-01 DIAGNOSIS — I10 Essential (primary) hypertension: Secondary | ICD-10-CM

## 2022-12-04 ENCOUNTER — Other Ambulatory Visit: Payer: Self-pay | Admitting: Family Medicine

## 2022-12-08 ENCOUNTER — Ambulatory Visit (INDEPENDENT_AMBULATORY_CARE_PROVIDER_SITE_OTHER): Payer: Medicare Other | Admitting: Family Medicine

## 2022-12-08 ENCOUNTER — Encounter: Payer: Self-pay | Admitting: Family Medicine

## 2022-12-08 VITALS — BP 145/89 | HR 72 | Temp 98.1°F | Resp 16 | Ht 68.0 in | Wt 207.0 lb

## 2022-12-08 DIAGNOSIS — E7800 Pure hypercholesterolemia, unspecified: Secondary | ICD-10-CM

## 2022-12-08 DIAGNOSIS — I10 Essential (primary) hypertension: Secondary | ICD-10-CM

## 2022-12-08 MED ORDER — AMLODIPINE BESYLATE 10 MG PO TABS: 10.0000 mg | ORAL_TABLET | Freq: Every day | ORAL | 1 refills | Status: DC

## 2022-12-08 NOTE — Progress Notes (Signed)
Patient is here for their  follow-up Patient has no concerns today Care gaps have been discussed with patient

## 2022-12-09 ENCOUNTER — Encounter: Payer: Self-pay | Admitting: Family Medicine

## 2022-12-09 ENCOUNTER — Other Ambulatory Visit: Payer: Self-pay | Admitting: Family Medicine

## 2022-12-09 NOTE — Progress Notes (Signed)
Established Patient Office Visit  Subjective    Patient ID: Meredith Randolph, female    DOB: November 01, 1955  Age: 67 y.o. MRN: [id number]  CC:  Chief Complaint  Patient presents with   Hypertension    HPI Meredith Randolph presents for routine follow up of chronic med issues. Patient denies acute complaints or concerns.    Outpatient Encounter Medications as of 12/08/2022  Medication Sig   amLODipine (NORVASC) 10 MG tablet Take 1 tablet (10 mg total) by mouth daily.   aspirin EC 81 MG EC tablet Take 1 tablet (81 mg total) by mouth daily. Swallow whole.   losartan-hydrochlorothiazide (HYZAAR) 100-25 MG tablet TAKE 1 TABLET BY MOUTH EVERY DAY   Multiple Vitamins-Minerals (ONE-A-DAY WOMENS PO) Take 3 tablets by mouth daily. Gummy   rosuvastatin (CRESTOR) 20 MG tablet Take 1 tablet (20 mg total) by mouth daily.   [DISCONTINUED] amLODipine (NORVASC) 5 MG tablet TAKE 1 TABLET (5 MG TOTAL) BY MOUTH DAILY.   No facility-administered encounter medications on file as of 12/08/2022.    Past Medical History:  Diagnosis Date   Hypertension     No past surgical history on file.  Family History  Problem Relation Age of Onset   Lung cancer Father        smoker    Social History   Socioeconomic History   Marital status: Married    Spouse name: Not on file   Number of children: 1   Years of education: Not on file   Highest education level: Not on file  Occupational History   Occupation: retired  Tobacco Use   Smoking status: Former    Packs/day: 1.00    Years: 20.00    Total pack years: 20.00    Types: Cigarettes    Start date: 04/16/1979    Quit date: 04/16/1999    Years since quitting: 23.6   Smokeless tobacco: Never  Vaping Use   Vaping Use: Never used  Substance and Sexual Activity   Alcohol use: No   Drug use: No   Sexual activity: Not Currently  Other Topics Concern   Not on file  Social History Narrative   Employment: retired.   Social Determinants of Health    Financial Resource Strain: Low Risk  (10/26/2022)   Overall Financial Resource Strain (CARDIA)    Difficulty of Paying Living Expenses: Not hard at all  Food Insecurity: No Food Insecurity (10/26/2022)   Hunger Vital Sign    Worried About Running Out of Food in the Last Year: Never true    Ran Out of Food in the Last Year: Never true  Transportation Needs: No Transportation Needs (10/26/2022)   PRAPARE - Hydrologist (Medical): No    Lack of Transportation (Non-Medical): No  Physical Activity: Sufficiently Active (10/26/2022)   Exercise Vital Sign    Days of Exercise per Week: 5 days    Minutes of Exercise per Session: 30 min  Stress: No Stress Concern Present (10/26/2022)   [place]    Feeling of Stress : Not at all  Social Connections: Unknown (04/16/2019)   Social Connection and Isolation Panel [NHANES]    Frequency of Communication with Friends and Family: More than three times a week    Frequency of Social Gatherings with Friends and Family: Three times a week    Attends Religious Services: Patient refused    Active Member of Clubs or Organizations: Patient  refused    Attends Archivist Meetings: Patient refused    Marital Status: Married  Human resources officer Violence: Not At Risk (04/16/2019)   Humiliation, Afraid, Rape, and Kick questionnaire    Fear of Current or Ex-Partner: No    Emotionally Abused: No    Physically Abused: No    Sexually Abused: No    Review of Systems  All other systems reviewed and are negative.       Objective    BP (!) 145/89   Pulse 72   Temp 98.1 F (36.7 C) (Oral)   Resp 16   Ht '5\' 8"'$  (1.727 m)   Wt 207 lb (93.9 kg)   SpO2 97%   BMI 31.47 kg/m   Physical Exam Vitals and nursing note reviewed.  Constitutional:      General: She is not in acute distress. Cardiovascular:     Rate and Rhythm: Normal rate and regular rhythm.   Pulmonary:     Effort: Pulmonary effort is normal.     Breath sounds: Normal breath sounds.  Abdominal:     Palpations: Abdomen is soft.     Tenderness: There is no abdominal tenderness.  Musculoskeletal:     Right lower leg: No edema.     Left lower leg: No edema.  Neurological:     General: No focal deficit present.     Mental Status: She is alert and oriented to person, place, and time.         Assessment & Plan:   1. Essential hypertension Slightly elevated readings. Will increase amlodipine from 5 mg to 10 mg.   2. Pure hypercholesterolemia Continue     Return in about 3 months (around 03/08/2023) for physical.   Meredith Sax, MD

## 2022-12-09 NOTE — Telephone Encounter (Signed)
Requested Prescriptions  Pending Prescriptions Disp Refills   amLODipine (NORVASC) 5 MG tablet [Pharmacy Med Name: AMLODIPINE BESYLATE 5 MG TAB] 90 tablet 0    Sig: TAKE 1 TABLET (5 MG TOTAL) BY MOUTH DAILY.     Cardiovascular: Calcium Channel Blockers 2 Failed - 12/09/2022  9:42 AM      Failed - Last BP in normal range    BP Readings from Last 1 Encounters:  12/08/22 (!) 145/89         Passed - Last Heart Rate in normal range    Pulse Readings from Last 1 Encounters:  12/08/22 72         Passed - Valid encounter within last 6 months    Recent Outpatient Visits           [place] [facility], MD   6 months ago Essential hypertension   [place] [facility], MD   9 months ago Essential hypertension   [place] [facility], MD   10 months ago Uncontrolled hypertension    [facility] MD   11 months ago TIA (transient ischemic attack)   [facility], Kriste Basque, NP       Future Appointments             In 2 months Dorna Mai, MD [facility]

## 2023-03-08 ENCOUNTER — Encounter: Payer: Self-pay | Admitting: Family Medicine

## 2023-03-08 ENCOUNTER — Ambulatory Visit (INDEPENDENT_AMBULATORY_CARE_PROVIDER_SITE_OTHER): Payer: Medicare Other | Admitting: Family Medicine

## 2023-03-08 VITALS — BP 146/84 | HR 61 | Temp 98.1°F | Resp 16 | Wt 211.2 lb

## 2023-03-08 DIAGNOSIS — I10 Essential (primary) hypertension: Secondary | ICD-10-CM | POA: Diagnosis not present

## 2023-03-08 DIAGNOSIS — E669 Obesity, unspecified: Secondary | ICD-10-CM | POA: Diagnosis not present

## 2023-03-08 DIAGNOSIS — E7800 Pure hypercholesterolemia, unspecified: Secondary | ICD-10-CM | POA: Diagnosis not present

## 2023-03-08 NOTE — Progress Notes (Signed)
Patient is here for their 3 month follow-up Patient has no concerns today Care gaps have been discussed with patient  

## 2023-03-14 ENCOUNTER — Encounter: Payer: Self-pay | Admitting: Family Medicine

## 2023-03-14 NOTE — Progress Notes (Signed)
Established Patient Office Visit  Subjective    Patient ID: Meredith Randolph, female    DOB: [birth year]  Age: 67 y.o. MRN: [id number]  CC:  Chief Complaint  Patient presents with   Follow-up    HPI Meredith Randolph presents for routine follow up of chronic med issues. Patient denies acute complaints or concerns.    Outpatient Encounter Medications as of 03/08/2023  Medication Sig   amLODipine (NORVASC) 10 MG tablet Take 1 tablet (10 mg total) by mouth daily.   amLODipine (NORVASC) 5 MG tablet TAKE 1 TABLET (5 MG TOTAL) BY MOUTH DAILY.   aspirin EC 81 MG EC tablet Take 1 tablet (81 mg total) by mouth daily. Swallow whole.   losartan-hydrochlorothiazide (HYZAAR) 100-25 MG tablet TAKE 1 TABLET BY MOUTH EVERY DAY   Multiple Vitamins-Minerals (ONE-A-DAY WOMENS PO) Take 3 tablets by mouth daily. Gummy   rosuvastatin (CRESTOR) 20 MG tablet Take 1 tablet (20 mg total) by mouth daily.   No facility-administered encounter medications on file as of 03/08/2023.    Past Medical History:  Diagnosis Date   Hypertension     No past surgical history on file.  Family History  Problem Relation Age of Onset   Lung cancer Father        smoker    Social History   Socioeconomic History   Marital status: Married    Spouse name: Not on file   Number of children: 1   Years of education: Not on file   Highest education level: Not on file  Occupational History   Occupation: retired  Tobacco Use   Smoking status: Former    Packs/day: 1.00    Years: 20.00    Additional pack years: 0.00    Total pack years: 20.00    Types: Cigarettes    Start date: 04/16/1979    Quit date: 04/16/1999    Years since quitting: 23.9   Smokeless tobacco: Never  Vaping Use   Vaping Use: Never used  Substance and Sexual Activity   Alcohol use: No   Drug use: No   Sexual activity: Not Currently  Other Topics Concern   Not on file  Social History Narrative   Employment: retired.   Social Determinants  of Health   Financial Resource Strain: Low Risk  (10/26/2022)   Overall Financial Resource Strain (CARDIA)    Difficulty of Paying Living Expenses: Not hard at all  Food Insecurity: No Food Insecurity (10/26/2022)   Hunger Vital Sign    Worried About Running Out of Food in the Last Year: Never true    Ran Out of Food in the Last Year: Never true  Transportation Needs: No Transportation Needs (10/26/2022)   PRAPARE - Administrator, Civil Service (Medical): No    Lack of Transportation (Non-Medical): No  Physical Activity: Sufficiently Active (10/26/2022)   Exercise Vital Sign    Days of Exercise per Week: 5 days    Minutes of Exercise per Session: 30 min  Stress: No Stress Concern Present (10/26/2022)   [employer] of Occupational Health - Occupational Stress Questionnaire    Feeling of Stress : Not at all  Social Connections: Unknown (04/16/2019)   Social Connection and Isolation Panel [NHANES]    Frequency of Communication with Friends and Family: More than three times a week    Frequency of Social Gatherings with Friends and Family: Three times a week    Attends Religious Services: Patient declined    Active  Member of Clubs or Organizations: Patient declined    Attends Banker Meetings: Patient declined    Marital Status: Married  Catering manager Violence: Not At Risk (04/16/2019)   Humiliation, Afraid, Rape, and Kick questionnaire    Fear of Current or Ex-Partner: No    Emotionally Abused: No    Physically Abused: No    Sexually Abused: No    Review of Systems  All other systems reviewed and are negative.       Objective    BP (!) 146/84   Pulse 61   Temp 98.1 F (36.7 C) (Oral)   Resp 16   Wt 211 lb 3.2 oz (95.8 kg)   SpO2 92%   BMI 32.11 kg/m   Physical Exam Vitals and nursing note reviewed.  Constitutional:      General: She is not in acute distress. Cardiovascular:     Rate and Rhythm: Normal rate and regular rhythm.   Pulmonary:     Effort: Pulmonary effort is normal.     Breath sounds: Normal breath sounds.  Abdominal:     Palpations: Abdomen is soft.     Tenderness: There is no abdominal tenderness.  Musculoskeletal:     Right lower leg: No edema.     Left lower leg: No edema.  Neurological:     General: No focal deficit present.     Mental Status: She is alert and oriented to person, place, and time.         Assessment & Plan:   1. Essential hypertension Continue.  2. Pure hypercholesterolemia Continue   3. Obesity, Class I, BMI 30-34.9 Discussed dietary and activity options.     Return in about 6 months (around 09/08/2023).   Tommie Raymond, MD

## 2023-05-23 ENCOUNTER — Other Ambulatory Visit: Payer: Self-pay | Admitting: Family Medicine

## 2023-05-23 DIAGNOSIS — I10 Essential (primary) hypertension: Secondary | ICD-10-CM

## 2023-08-27 ENCOUNTER — Other Ambulatory Visit: Payer: Self-pay | Admitting: Family Medicine

## 2023-09-08 ENCOUNTER — Encounter: Payer: Self-pay | Admitting: Family Medicine

## 2023-09-08 ENCOUNTER — Ambulatory Visit (INDEPENDENT_AMBULATORY_CARE_PROVIDER_SITE_OTHER): Payer: Medicare Other | Admitting: Family Medicine

## 2023-09-08 VITALS — BP 159/103 | HR 55 | Temp 97.8°F | Resp 16 | Ht 67.0 in | Wt 215.6 lb

## 2023-09-08 DIAGNOSIS — I10 Essential (primary) hypertension: Secondary | ICD-10-CM | POA: Diagnosis not present

## 2023-09-08 DIAGNOSIS — E66811 Obesity, class 1: Secondary | ICD-10-CM | POA: Diagnosis not present

## 2023-09-08 DIAGNOSIS — Z23 Encounter for immunization: Secondary | ICD-10-CM | POA: Diagnosis not present

## 2023-09-08 DIAGNOSIS — E7800 Pure hypercholesterolemia, unspecified: Secondary | ICD-10-CM | POA: Diagnosis not present

## 2023-09-08 DIAGNOSIS — E669 Obesity, unspecified: Secondary | ICD-10-CM

## 2023-09-08 DIAGNOSIS — Z6833 Body mass index (BMI) 33.0-33.9, adult: Secondary | ICD-10-CM

## 2023-09-12 ENCOUNTER — Encounter: Payer: Self-pay | Admitting: Family Medicine

## 2023-09-12 NOTE — Progress Notes (Signed)
Established Patient Office Visit  Subjective    Patient ID: Meredith Randolph, female    DOB: 1955-11-30  Age: 67 y.o. MRN: [id number]  CC:  Chief Complaint  Patient presents with   Follow-up    6 month    HPI Meredith Randolph presents for routine follow up of hypertension and hyperlipidemia. Patient reports taking meds as recommended and denies acute complaints or concerns.   Outpatient Encounter Medications as of 09/08/2023  Medication Sig   amLODipine (NORVASC) 10 MG tablet TAKE 1 TABLET BY MOUTH EVERY DAY   aspirin EC 81 MG EC tablet Take 1 tablet (81 mg total) by mouth daily. Swallow whole.   losartan-hydrochlorothiazide (HYZAAR) 100-25 MG tablet TAKE 1 TABLET BY MOUTH EVERY DAY   Multiple Vitamins-Minerals (ONE-A-DAY WOMENS PO) Take 3 tablets by mouth daily. Gummy   rosuvastatin (CRESTOR) 20 MG tablet Take 1 tablet (20 mg total) by mouth daily.   [DISCONTINUED] amLODipine (NORVASC) 5 MG tablet TAKE 1 TABLET (5 MG TOTAL) BY MOUTH DAILY.   No facility-administered encounter medications on file as of 09/08/2023.    Past Medical History:  Diagnosis Date   Hypertension     History reviewed. No pertinent surgical history.  Family History  Problem Relation Age of Onset   Lung cancer Father        smoker    Social History   Socioeconomic History   Marital status: Married    Spouse name: Not on file   Number of children: 1   Years of education: Not on file   Highest education level: Not on file  Occupational History   Occupation: retired  Tobacco Use   Smoking status: Former    Current packs/day: 0.00    Average packs/day: 1 pack/day for 20.0 years (20.0 ttl pk-yrs)    Types: Cigarettes    Start date: 04/16/1979    Quit date: 04/16/1999    Years since quitting: 24.4   Smokeless tobacco: Never  Vaping Use   Vaping status: Never Used  Substance and Sexual Activity   Alcohol use: No   Drug use: No   Sexual activity: Not Currently  Other Topics Concern    Not on file  Social History Narrative   Employment: retired.   Social Determinants of Health   Financial Resource Strain: Low Risk  (10/26/2022)   Overall Financial Resource Strain (CARDIA)    Difficulty of Paying Living Expenses: Not hard at all  Food Insecurity: No Food Insecurity (10/26/2022)   Hunger Vital Sign    Worried About Running Out of Food in the Last Year: Never true    Ran Out of Food in the Last Year: Never true  Transportation Needs: No Transportation Needs (10/26/2022)   PRAPARE - Administrator, Civil Service (Medical): No    Lack of Transportation (Non-Medical): No  Physical Activity: Sufficiently Active (10/26/2022)   Exercise Vital Sign    Days of Exercise per Week: 5 days    Minutes of Exercise per Session: 30 min  Stress: No Stress Concern Present (10/26/2022)   [employer] of Occupational Health - Occupational Stress Questionnaire    Feeling of Stress : Not at all  Social Connections: Moderately Integrated (09/08/2023)   Social Connection and Isolation Panel [NHANES]    Frequency of Communication with Friends and Family: Once a week    Frequency of Social Gatherings with Friends and Family: Once a week    Attends Religious Services: More than 4 times per  year    Active Member of Clubs or Organizations: Yes    Attends Banker Meetings: More than 4 times per year    Marital Status: Married  Catering manager Violence: Not At Risk (04/16/2019)   Humiliation, Afraid, Rape, and Kick questionnaire    Fear of Current or Ex-Partner: No    Emotionally Abused: No    Physically Abused: No    Sexually Abused: No    Review of Systems  All other systems reviewed and are negative.       Objective    BP (!) 159/103 (BP Location: Right Arm, Patient Position: Sitting, Cuff Size: Normal)   Pulse (!) 55   Temp 97.8 F (36.6 C) (Oral)   Resp 16   Ht 5\' 7"  (1.702 m)   Wt 215 lb 9.6 oz (97.8 kg)   SpO2 94%   BMI 33.77 kg/m   Physical  Exam Vitals and nursing note reviewed.  Constitutional:      General: She is not in acute distress.    Appearance: She is obese.  Cardiovascular:     Rate and Rhythm: Normal rate and regular rhythm.  Pulmonary:     Effort: Pulmonary effort is normal.     Breath sounds: Normal breath sounds.  Abdominal:     Palpations: Abdomen is soft.     Tenderness: There is no abdominal tenderness.  Musculoskeletal:     Right lower leg: No edema.     Left lower leg: No edema.  Neurological:     General: No focal deficit present.     Mental Status: She is alert and oriented to person, place, and time.         Assessment & Plan:   Essential hypertension  Pure hypercholesterolemia  Obesity, Class I, BMI 30-34.9  Encounter for immunization -     Flu Vaccine Trivalent High Dose (Fluad)     Return in about 6 months (around 03/07/2024) for follow up.   Meredith Raymond, MD

## 2023-11-15 ENCOUNTER — Other Ambulatory Visit: Payer: Self-pay | Admitting: Family Medicine

## 2023-11-15 DIAGNOSIS — I10 Essential (primary) hypertension: Secondary | ICD-10-CM

## 2023-12-01 ENCOUNTER — Encounter: Payer: Self-pay | Admitting: Family Medicine

## 2023-12-01 ENCOUNTER — Ambulatory Visit (INDEPENDENT_AMBULATORY_CARE_PROVIDER_SITE_OTHER): Payer: Medicare Other | Admitting: Family Medicine

## 2023-12-01 VITALS — BP 133/82 | HR 58 | Temp 98.0°F | Resp 16 | Ht 66.0 in | Wt 218.4 lb

## 2023-12-01 DIAGNOSIS — I10 Essential (primary) hypertension: Secondary | ICD-10-CM

## 2023-12-01 DIAGNOSIS — E66812 Obesity, class 2: Secondary | ICD-10-CM | POA: Diagnosis not present

## 2023-12-01 DIAGNOSIS — E7800 Pure hypercholesterolemia, unspecified: Secondary | ICD-10-CM | POA: Diagnosis not present

## 2023-12-01 DIAGNOSIS — Z6835 Body mass index (BMI) 35.0-35.9, adult: Secondary | ICD-10-CM

## 2023-12-06 ENCOUNTER — Encounter: Payer: Self-pay | Admitting: Family Medicine

## 2023-12-06 NOTE — Progress Notes (Signed)
 Established Patient Office Visit  Subjective    Patient ID: Meredith Randolph, female    DOB: 14-Nov-1955  Age: 68 y.o. MRN: [id number]  CC:  Chief Complaint  Patient presents with   Follow-up    3 month    HPI Meredith Randolph presents for routine follow up of chronic med issues including hypertension. Patient reports med compliance and denies acute complaints.   Outpatient Encounter Medications as of 12/01/2023  Medication Sig   amLODipine (NORVASC) 10 MG tablet TAKE 1 TABLET BY MOUTH EVERY DAY   aspirin EC 81 MG EC tablet Take 1 tablet (81 mg total) by mouth daily. Swallow whole.   losartan-hydrochlorothiazide (HYZAAR) 100-25 MG tablet TAKE 1 TABLET BY MOUTH EVERY DAY   Multiple Vitamins-Minerals (ONE-A-DAY WOMENS PO) Take 3 tablets by mouth daily. Gummy   rosuvastatin (CRESTOR) 20 MG tablet Take 1 tablet (20 mg total) by mouth daily.   No facility-administered encounter medications on file as of 12/01/2023.    Past Medical History:  Diagnosis Date   Hypertension     History reviewed. No pertinent surgical history.  Family History  Problem Relation Age of Onset   Lung cancer Father        smoker    Social History   Socioeconomic History   Marital status: Married    Spouse name: Not on file   Number of children: 1   Years of education: Not on file   Highest education level: Not on file  Occupational History   Occupation: retired  Tobacco Use   Smoking status: Former    Current packs/day: 0.00    Average packs/day: 1 pack/day for 20.0 years (20.0 ttl pk-yrs)    Types: Cigarettes    Start date: 04/16/1979    Quit date: 04/16/1999    Years since quitting: 24.6   Smokeless tobacco: Never  Vaping Use   Vaping status: Never Used  Substance and Sexual Activity   Alcohol use: No   Drug use: No   Sexual activity: Not Currently  Other Topics Concern   Not on file  Social History Narrative   Employment: retired.   Social Drivers of Research scientist (physical sciences) Strain: Low Risk  (10/26/2022)   Overall Financial Resource Strain (CARDIA)    Difficulty of Paying Living Expenses: Not hard at all  Food Insecurity: No Food Insecurity (10/26/2022)   Hunger Vital Sign    Worried About Running Out of Food in the Last Year: Never true    Ran Out of Food in the Last Year: Never true  Transportation Needs: No Transportation Needs (10/26/2022)   PRAPARE - Administrator, Civil Service (Medical): No    Lack of Transportation (Non-Medical): No  Physical Activity: Sufficiently Active (10/26/2022)   Exercise Vital Sign    Days of Exercise per Week: 5 days    Minutes of Exercise per Session: 30 min  Stress: No Stress Concern Present (10/26/2022)   [employer] of Occupational Health - Occupational Stress Questionnaire    Feeling of Stress : Not at all  Social Connections: Moderately Integrated (09/08/2023)   Social Connection and Isolation Panel [NHANES]    Frequency of Communication with Friends and Family: Once a week    Frequency of Social Gatherings with Friends and Family: Once a week    Attends Religious Services: More than 4 times per year    Active Member of [employer] or Organizations: Yes    Attends Banker Meetings:  More than 4 times per year    Marital Status: Married  Catering manager Violence: Not At Risk (04/16/2019)   Humiliation, Afraid, Rape, and Kick questionnaire    Fear of Current or Ex-Partner: No    Emotionally Abused: No    Physically Abused: No    Sexually Abused: No    Review of Systems  All other systems reviewed and are negative.       Objective    BP 133/82 (BP Location: Right Arm, Patient Position: Sitting, Cuff Size: Normal)   Pulse (!) 58   Temp 98 F (36.7 C) (Oral)   Resp 16   Ht 5\' 6"  (1.676 m)   Wt 218 lb 6.4 oz (99.1 kg)   SpO2 94%   BMI 35.25 kg/m   Physical Exam Vitals and nursing note reviewed.  Constitutional:      General: She is not in acute distress.     Appearance: She is obese.  Cardiovascular:     Rate and Rhythm: Normal rate and regular rhythm.  Pulmonary:     Effort: Pulmonary effort is normal.     Breath sounds: Normal breath sounds.  Abdominal:     Palpations: Abdomen is soft.     Tenderness: There is no abdominal tenderness.  Musculoskeletal:     Right lower leg: No edema.     Left lower leg: No edema.  Neurological:     General: No focal deficit present.     Mental Status: She is alert and oriented to person, place, and time.         Assessment & Plan:   Essential hypertension  Pure hypercholesterolemia  Class 2 severe obesity due to excess calories with serious comorbidity and body mass index (BMI) of 35.0 to 35.9 in adult [facility])     Return in about 4 months (around 03/30/2024).   Meredith Raymond, MD

## 2023-12-07 ENCOUNTER — Ambulatory Visit: Payer: Medicare Other | Admitting: Family Medicine

## 2024-02-15 IMAGING — MR MR HEAD W/O CM
8 of 10 series · 34 of 48 positions shown · IV contrast (gadavist)
Comparison: January 01, 22.

CLINICAL DATA: Transient ischemic attack (TIA); Neuro deficit,
acute, stroke suspected; stroke workup

EXAM:
MRI HEAD WITHOUT CONTRAST
MRA HEAD WITHOUT CONTRAST
MRA NECK WITHOUT AND WITH CONTRAST
TECHNIQUE: Multiplanar, multi-echo pulse sequences of the brain and surrounding
structures were acquired without intravenous contrast. Angiographic
images of the Circle of Willis were acquired using MRA technique
without intravenous contrast. Angiographic images of the neck were
acquired using MRA technique without and with intravenous contrast.
Carotid stenosis measurements (when applicable) are obtained
utilizing NASCET criteria, using the distal internal carotid
diameter as the denominator.
CONTRAST:  9mL GADAVIST GADOBUTROL 1 MMOL/ML IV SOLN

[Series 4: DWI · axial · 3.0mm · 1.02mm/px · z∈[-63,+77]mm · 9 of 96 slices shown (1 of 4)]
[im 1/96]
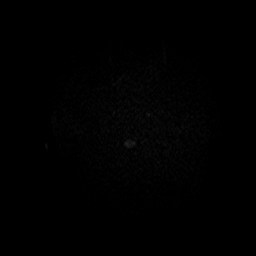
[im 12/96]
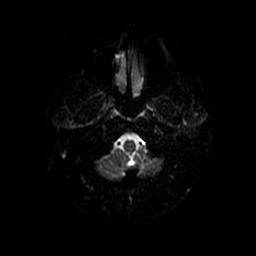
[im 24/96]
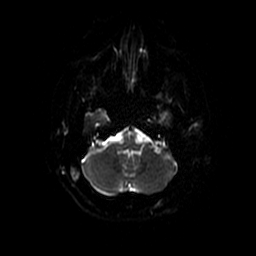
[im 36/96]
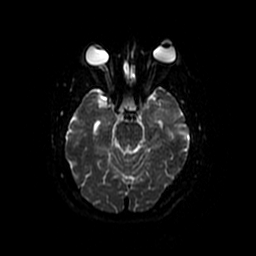
[im 48/96]
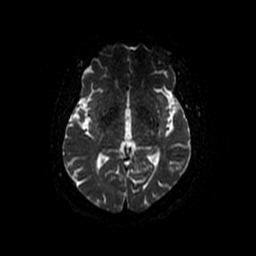
[im 60/96]
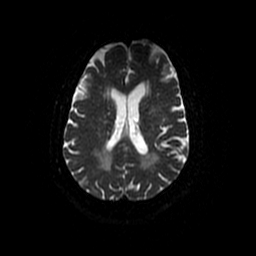
[im 72/96]
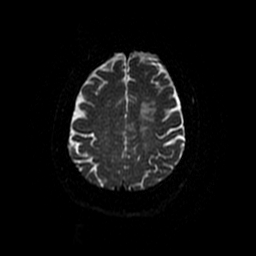
[im 84/96]
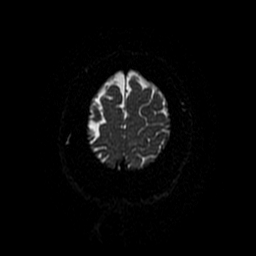
[im 96/96]
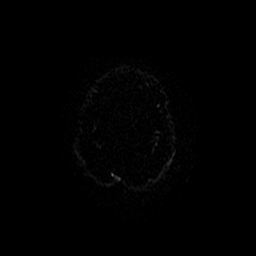

[Series 5: DWI · coronal · 5.0mm · 1.09mm/px · 6 of 72 slices shown (2 of 4)]
[im 1/72]
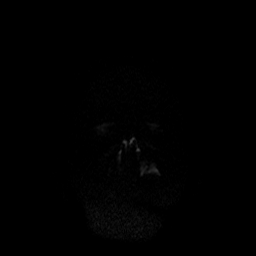
[im 15/72]
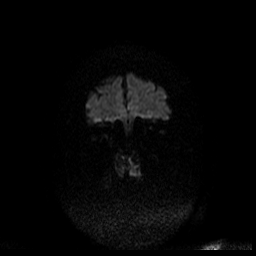
[im 29/72]
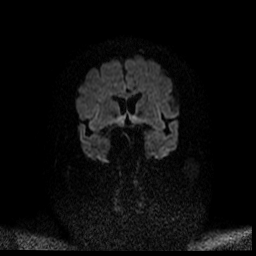
[im 43/72]
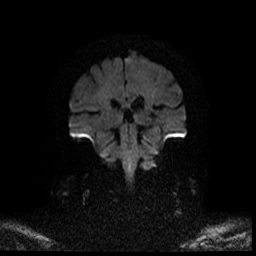
[im 57/72]
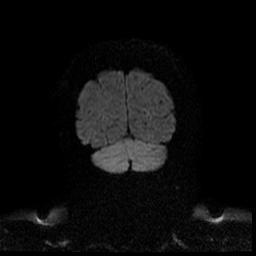
[im 72/72]
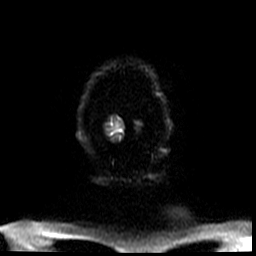

[Series 6: T1 · sagittal · 5.0mm · 0.47mm/px · 1 of 25 slices shown]
[im 1/25]
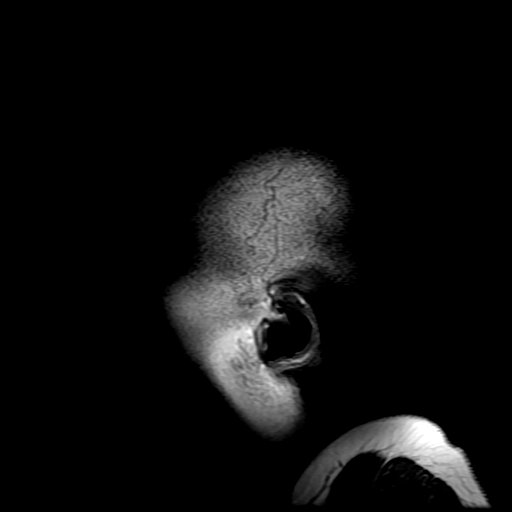

[Series 7: T2 · axial · 5.0mm · 0.43mm/px · z∈[-64,+79]mm · 3 of 25 slices shown (1 of 2)]
[im 1/25]
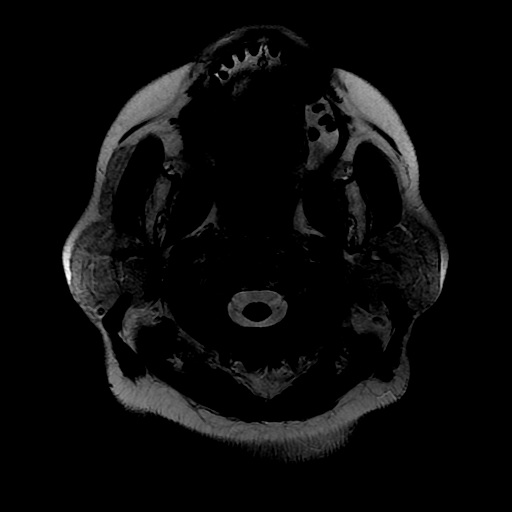
[im 13/25]
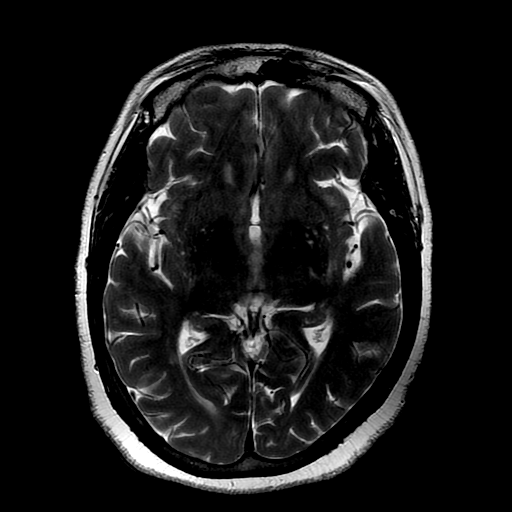
[im 25/25]
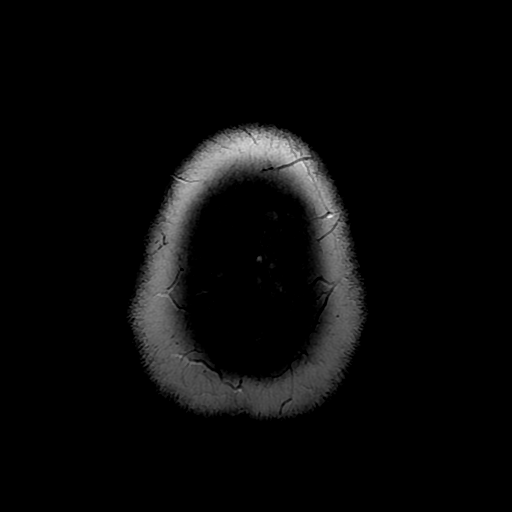

[Series 8: FLAIR · axial · 5.0mm · 0.43mm/px · z∈[-64,+79]mm · 3 of 25 slices shown]
[im 1/25]
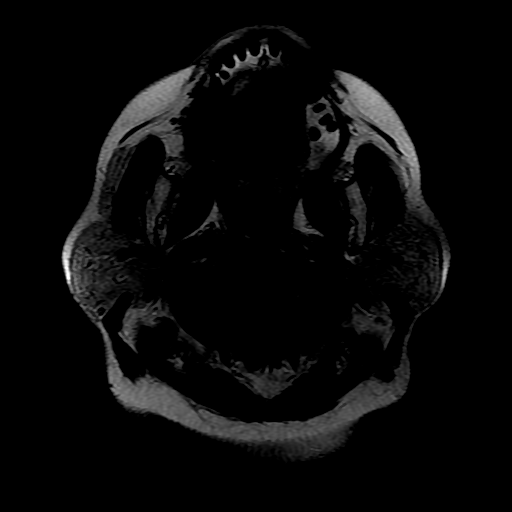
[im 13/25]
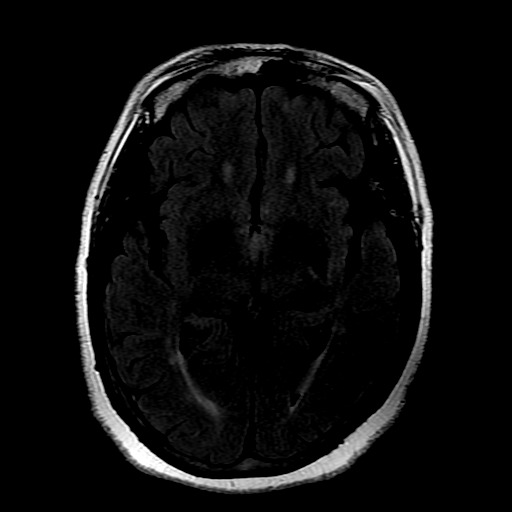
[im 25/25]
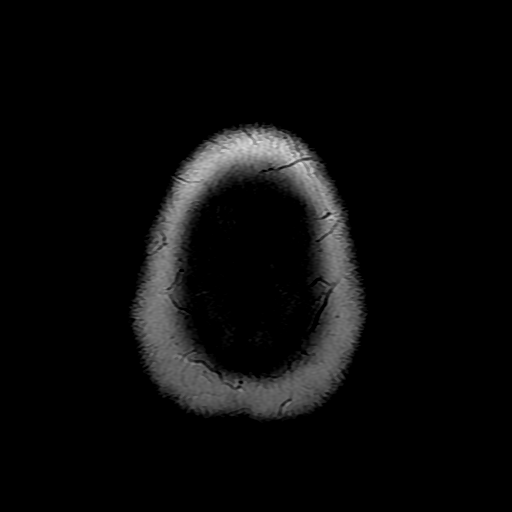

[Series 11: T2 · coronal · 5.0mm · 0.39mm/px · 3 of 27 slices shown (2 of 2)]
[im 1/27]
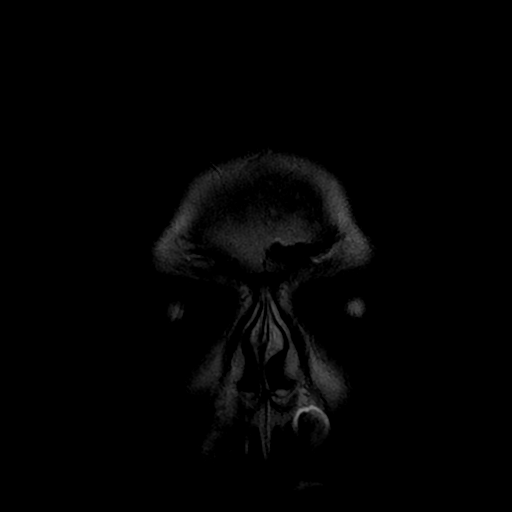
[im 14/27]
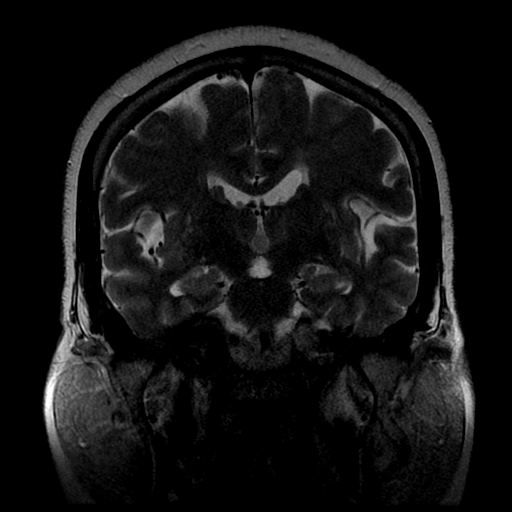
[im 27/27]
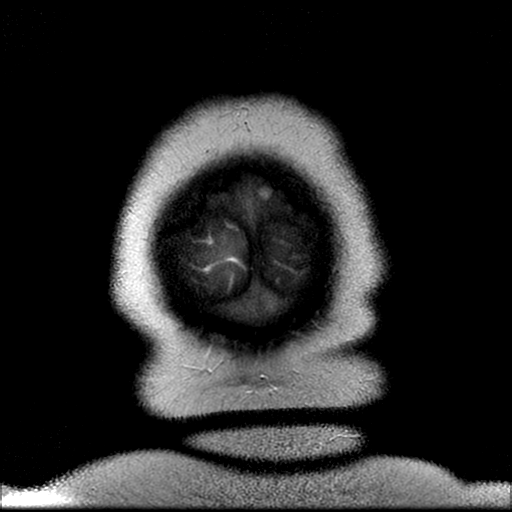

[Series 400: DWI · axial · 3.0mm · 1.02mm/px · z∈[-63,+77]mm · 5 of 48 slices shown (3 of 4)]
[im 1/48]
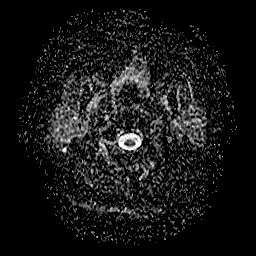
[im 12/48]
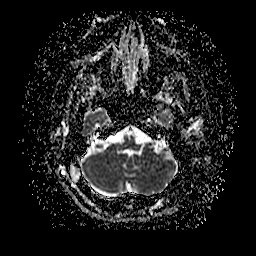
[im 24/48]
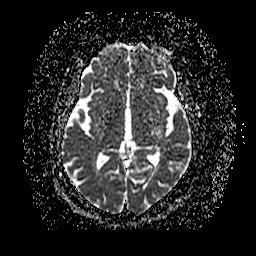
[im 36/48]
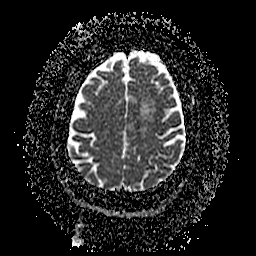
[im 48/48]
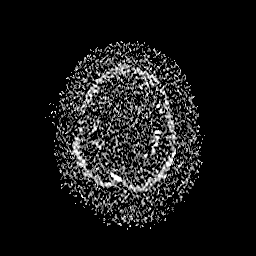

[Series 500: DWI · coronal · 5.0mm · 1.09mm/px · 4 of 36 slices shown (4 of 4)]
[im 1/36]
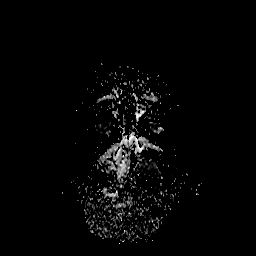
[im 12/36]
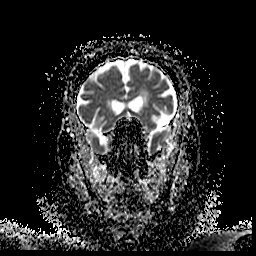
[im 24/36]
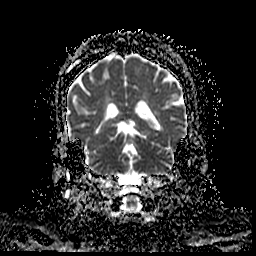
[im 36/36]
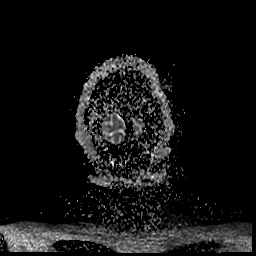

[34 of 48 positions shown; findings below may reference images not displayed]

FINDINGS: MRI HEAD FINDINGS

Brain: No acute infarction, acute hemorrhage, hydrocephalus,
extra-axial collection or mass lesion. No pathologic intracranial
enhancement. Patchy white matter T2/FLAIR hyperintensities,
nonspecific compatible with chronic microvascular ischemic disease.
Small foci of susceptibility artifact and T2 hypointensity within
the right basal ganglia and parieto-occipital region which probably
represents prior microhemorrhage and/or cavernous malformation.
Focus of

Vascular: See below

Skull and upper cervical spine: Chronic lesion in the posterior
right temporal bone posterior to the mastoid air cells which is
stable from 5111 exam and consistent with a benign process, likely
fibrous dysplasia. Otherwise, normal marrow signal.

Sinuses/Orbits: Visualized sinuses are clear.  Unremarkable orbits.

Other: No mastoid effusions.

MRA HEAD FINDINGS

Anterior circulation: Bilateral intracranial ICAs, MCAs, and ACAs
are patent without proximal hemodynamically significant stenosis. No
aneurysm identified.

Posterior circulation: Intradural vertebral arteries, basilar artery
and posterior cerebral arteries are patent without proximal
hemodynamically significant stenosis. Right posterior communicating
artery is present. Multifocal moderate left P2 PCA stenosis.

MRA NECK FINDINGS

Motion limited with particularly on limited evaluation proximally.

Aortic arch: Limited assessment with great vessel origins appearing
patent.

Right carotid system: Limited assessment due to artifact proximally.
The visible common carotid artery and internal carotid artery are
patent without high-grade stenosis.

Left carotid system: Limited assessment due to artifact proximally.
The visible common carotid artery and internal carotid artery are
patent without high-grade stenosis.

Vertebral arteries: Essentially nondiagnostic evaluation of the
vertebral artery origins due to artifact. The visible vertebral
arteries are patent without evidence of high-grade stenosis.
Tortuous bilaterally.
IMPRESSION: MRI:

1. No acute intracranial abnormality.
2. Moderate chronic microvascular ischemic disease.
3. Small foci of susceptibility artifact and T2 hypointensity within
the right basal ganglia and parieto-occipital region which probably
represents prior microhemorrhages and/or small cavernous
malformations.

MRA head:

1. No large vessel occlusion.
2. Multifocal moderate left P2 PCA stenosis.

MRA neck:

Motion limited (particularly proximally) without visible high-grade
stenosis.

## 2024-02-18 ENCOUNTER — Other Ambulatory Visit: Payer: Self-pay | Admitting: Family Medicine

## 2024-04-01 ENCOUNTER — Ambulatory Visit (INDEPENDENT_AMBULATORY_CARE_PROVIDER_SITE_OTHER): Payer: Medicare Other | Admitting: Family Medicine

## 2024-04-01 ENCOUNTER — Encounter: Payer: Self-pay | Admitting: Family Medicine

## 2024-04-01 VITALS — BP 168/110 | HR 62 | Temp 98.1°F | Resp 16 | Ht 67.0 in | Wt 211.2 lb

## 2024-04-01 DIAGNOSIS — I10 Essential (primary) hypertension: Secondary | ICD-10-CM | POA: Diagnosis not present

## 2024-04-01 DIAGNOSIS — Z6833 Body mass index (BMI) 33.0-33.9, adult: Secondary | ICD-10-CM | POA: Diagnosis not present

## 2024-04-01 DIAGNOSIS — E6609 Other obesity due to excess calories: Secondary | ICD-10-CM

## 2024-04-01 DIAGNOSIS — E66811 Obesity, class 1: Secondary | ICD-10-CM | POA: Diagnosis not present

## 2024-04-01 MED ORDER — METOPROLOL SUCCINATE ER 25 MG PO TB24
25.0000 mg | ORAL_TABLET | Freq: Every day | ORAL | 0 refills | Status: DC
Start: 2024-04-01 — End: 2024-06-27

## 2024-04-01 NOTE — Progress Notes (Unsigned)
 Patient is here for th3 month follow-up Patient has no concerns today Care gaps have been discussed with patient

## 2024-04-01 NOTE — Progress Notes (Unsigned)
 Established Patient Office Visit  Subjective    Patient ID: Meredith Randolph, female    DOB: December 26, 1955  Age: 68 y.o. MRN: [id number]  CC:  Chief Complaint  Patient presents with   Medical Management of Chronic Issues    HPI Meredith Randolph presents for routine follow up of hypertension. Patient reports that she has not been taking the amlodipine  as she did not like the way it made her feel.   Outpatient Encounter Medications as of 04/01/2024  Medication Sig   amLODipine  (NORVASC ) 10 MG tablet TAKE 1 TABLET BY MOUTH EVERY DAY   losartan -hydrochlorothiazide (HYZAAR) 100-25 MG tablet TAKE 1 TABLET BY MOUTH EVERY DAY   metoprolol succinate (TOPROL-XL) 25 MG 24 hr tablet Take 1 tablet (25 mg total) by mouth daily.   Multiple Vitamins-Minerals (ONE-A-DAY WOMENS PO) Take 3 tablets by mouth daily. Gummy   aspirin  EC 81 MG EC tablet Take 1 tablet (81 mg total) by mouth daily. Swallow whole. (Patient not taking: Reported on 04/01/2024)   rosuvastatin  (CRESTOR ) 20 MG tablet Take 1 tablet (20 mg total) by mouth daily. (Patient not taking: Reported on 04/01/2024)   No facility-administered encounter medications on file as of 04/01/2024.    Past Medical History:  Diagnosis Date   Hypertension     History reviewed. No pertinent surgical history.  Family History  Problem Relation Age of Onset   Lung cancer Father        smoker    Social History   Socioeconomic History   Marital status: Married    Spouse name: Not on file   Number of children: 1   Years of education: Not on file   Highest education level: Not on file  Occupational History   Occupation: retired  Tobacco Use   Smoking status: Former    Current packs/day: 0.00    Average packs/day: 1 pack/day for 20.0 years (20.0 ttl pk-yrs)    Types: Cigarettes    Start date: 04/16/1979    Quit date: 04/16/1999    Years since quitting: 24.9   Smokeless tobacco: Never  Vaping Use   Vaping status: Never Used  Substance and  Sexual Activity   Alcohol use: No   Drug use: No   Sexual activity: Not Currently  Other Topics Concern   Not on file  Social History Narrative   Employment: retired.   Social Drivers of Corporate investment banker Strain: Low Risk  (10/26/2022)   Overall Financial Resource Strain (CARDIA)    Difficulty of Paying Living Expenses: Not hard at all  Food Insecurity: No Food Insecurity (10/26/2022)   Hunger Vital Sign    Worried About Running Out of Food in the Last Year: Never true    Ran Out of Food in the Last Year: Never true  Transportation Needs: No Transportation Needs (10/26/2022)   PRAPARE - Administrator, Civil Service (Medical): No    Lack of Transportation (Non-Medical): No  Physical Activity: Sufficiently Active (10/26/2022)   Exercise Vital Sign    Days of Exercise per Week: 5 days    Minutes of Exercise per Session: 30 min  Stress: No Stress Concern Present (10/26/2022)   [employer] of Occupational Health - Occupational Stress Questionnaire    Feeling of Stress : Not at all  Social Connections: Moderately Integrated (09/08/2023)   Social Connection and Isolation Panel    Frequency of Communication with Friends and Family: Once a week    Frequency of Social  Gatherings with Friends and Family: Once a week    Attends Religious Services: More than 4 times per year    Active Member of [employer] or Organizations: Yes    Attends Engineer, structural: More than 4 times per year    Marital Status: Married  Catering manager Violence: Not At Risk (04/16/2019)   Humiliation, Afraid, Rape, and Kick questionnaire    Fear of Current or Ex-Partner: No    Emotionally Abused: No    Physically Abused: No    Sexually Abused: No    Review of Systems  All other systems reviewed and are negative.       Objective    BP (!) 168/110   Pulse 62   Temp 98.1 F (36.7 C) (Oral)   Resp 16   Ht 5' 7 (1.702 m)   Wt 211 lb 3.2 oz (95.8 kg)   SpO2 94%   BMI  33.08 kg/m   Physical Exam Vitals and nursing note reviewed.  Constitutional:      General: She is not in acute distress.    Appearance: She is obese.   Cardiovascular:     Rate and Rhythm: Normal rate and regular rhythm.  Pulmonary:     Effort: Pulmonary effort is normal.     Breath sounds: Normal breath sounds.  Abdominal:     Palpations: Abdomen is soft.     Tenderness: There is no abdominal tenderness.   Musculoskeletal:     Right lower leg: No edema.     Left lower leg: No edema.   Neurological:     General: No focal deficit present.     Mental Status: She is alert and oriented to person, place, and time.         Assessment & Plan:   Uncontrolled hypertension  Class 1 obesity due to excess calories with serious comorbidity and body mass index (BMI) of 33.0 to 33.9 in adult  Other orders -     Metoprolol Succinate ER; Take 1 tablet (25 mg total) by mouth daily.  Dispense: 90 tablet; Refill: 0   Discussed compliance. Metoprolol 25 mg added to regimen. (D/c amlodipine )  Return in about 4 weeks (around 04/29/2024) for follow up.   Arlo Lama, MD

## 2024-04-30 ENCOUNTER — Ambulatory Visit (INDEPENDENT_AMBULATORY_CARE_PROVIDER_SITE_OTHER): Admitting: Family Medicine

## 2024-04-30 ENCOUNTER — Encounter: Payer: Self-pay | Admitting: Family Medicine

## 2024-04-30 VITALS — BP 173/92 | HR 55 | Ht 67.0 in | Wt 212.0 lb

## 2024-04-30 DIAGNOSIS — E66811 Obesity, class 1: Secondary | ICD-10-CM

## 2024-04-30 DIAGNOSIS — Z6833 Body mass index (BMI) 33.0-33.9, adult: Secondary | ICD-10-CM

## 2024-04-30 DIAGNOSIS — E6609 Other obesity due to excess calories: Secondary | ICD-10-CM | POA: Diagnosis not present

## 2024-04-30 DIAGNOSIS — I10 Essential (primary) hypertension: Secondary | ICD-10-CM

## 2024-04-30 MED ORDER — SPIRONOLACTONE 50 MG PO TABS
50.0000 mg | ORAL_TABLET | Freq: Every day | ORAL | 0 refills | Status: DC
Start: 2024-04-30 — End: 2024-07-26

## 2024-04-30 NOTE — Progress Notes (Signed)
 Established Patient Office Visit  Subjective    Patient ID: Meredith Randolph, female    DOB: October 23, 1955  Age: 68 y.o. MRN: [id number]  CC:  Chief Complaint  Patient presents with   Hypertension    HPI [name] presents for follow up of hypertension. Patient had not been taking amlodipine . She thought that she was supposed to stop the amlodipine . She denies acute complaints.   Outpatient Encounter Medications as of 04/30/2024  Medication Sig   losartan -hydrochlorothiazide (HYZAAR) 100-25 MG tablet TAKE 1 TABLET BY MOUTH EVERY DAY   metoprolol  succinate (TOPROL -XL) 25 MG 24 hr tablet Take 1 tablet (25 mg total) by mouth daily.   Multiple Vitamins-Minerals (ONE-A-DAY WOMENS PO) Take 3 tablets by mouth daily. Gummy   amLODipine  (NORVASC ) 10 MG tablet TAKE 1 TABLET BY MOUTH EVERY DAY (Patient not taking: Reported on 04/30/2024)   aspirin  EC 81 MG EC tablet Take 1 tablet (81 mg total) by mouth daily. Swallow whole. (Patient not taking: Reported on 04/30/2024)   rosuvastatin  (CRESTOR ) 20 MG tablet Take 1 tablet (20 mg total) by mouth daily. (Patient not taking: Reported on 04/30/2024)   No facility-administered encounter medications on file as of 04/30/2024.    Past Medical History:  Diagnosis Date   Hypertension     No past surgical history on file.  Family History  Problem Relation Age of Onset   Lung cancer Father        smoker    Social History   Socioeconomic History   Marital status: Married    Spouse name: Not on file   Number of children: 1   Years of education: Not on file   Highest education level: Not on file  Occupational History   Occupation: retired  Tobacco Use   Smoking status: Former    Current packs/day: 0.00    Average packs/day: 1 pack/day for 20.0 years (20.0 ttl pk-yrs)    Types: Cigarettes    Start date: 04/16/1979    Quit date: 04/16/1999    Years since quitting: 25.0   Smokeless tobacco: Never  Vaping Use   Vaping status: Never Used   Substance and Sexual Activity   Alcohol use: No   Drug use: No   Sexual activity: Not Currently  Other Topics Concern   Not on file  Social History Narrative   Employment: retired.   Social Drivers of Corporate investment banker Strain: Low Risk  (10/26/2022)   Overall Financial Resource Strain (CARDIA)    Difficulty of Paying Living Expenses: Not hard at all  Food Insecurity: No Food Insecurity (10/26/2022)   Hunger Vital Sign    Worried About Running Out of Food in the Last Year: Never true    Ran Out of Food in the Last Year: Never true  Transportation Needs: No Transportation Needs (10/26/2022)   PRAPARE - Administrator, Civil Service (Medical): No    Lack of Transportation (Non-Medical): No  Physical Activity: Sufficiently Active (10/26/2022)   Exercise Vital Sign    Days of Exercise per Week: 5 days    Minutes of Exercise per Session: 30 min  Stress: No Stress Concern Present (10/26/2022)   [employer] of Occupational Health - Occupational Stress Questionnaire    Feeling of Stress : Not at all  Social Connections: Moderately Integrated (09/08/2023)   Social Connection and Isolation Panel    Frequency of Communication with Friends and Family: Once a week    Frequency of Social  Gatherings with Friends and Family: Once a week    Attends Religious Services: More than 4 times per year    Active Member of [employer] or Organizations: Yes    Attends Engineer, structural: More than 4 times per year    Marital Status: Married  Catering manager Violence: Not At Risk (04/16/2019)   Humiliation, Afraid, Rape, and Kick questionnaire    Fear of Current or Ex-Partner: No    Emotionally Abused: No    Physically Abused: No    Sexually Abused: No    Review of Systems  All other systems reviewed and are negative.       Objective    BP (!) 173/92   Pulse (!) 55   Ht 5' 7 (1.702 m)   Wt 212 lb (96.2 kg)   SpO2 93%   BMI 33.20 kg/m   Physical  Exam Vitals and nursing note reviewed.  Constitutional:      General: She is not in acute distress.    Appearance: She is obese.  Cardiovascular:     Rate and Rhythm: Normal rate and regular rhythm.  Pulmonary:     Effort: Pulmonary effort is normal.     Breath sounds: Normal breath sounds.  Abdominal:     Palpations: Abdomen is soft.     Tenderness: There is no abdominal tenderness.  Musculoskeletal:     Right lower leg: No edema.     Left lower leg: No edema.  Neurological:     General: No focal deficit present.     Mental Status: She is alert and oriented to person, place, and time.         Assessment & Plan:   1. Uncontrolled hypertension (Primary) Readings remain elevated. D/c metoprolol  2/2 decreased HR. Will restart amlodipine . Will add spironolactone . Patient does not want to be referred to cardiology for further eval/mgt  2. Class 1 obesity due to excess calories with serious comorbidity and body mass index (BMI) of 33.0 to 33.9 in adult   No follow-ups on file.   Tanda Raguel SQUIBB, MD

## 2024-05-10 ENCOUNTER — Other Ambulatory Visit: Payer: Self-pay | Admitting: Family Medicine

## 2024-05-10 DIAGNOSIS — I10 Essential (primary) hypertension: Secondary | ICD-10-CM

## 2024-05-16 ENCOUNTER — Other Ambulatory Visit: Payer: Self-pay | Admitting: Family Medicine

## 2024-05-28 ENCOUNTER — Ambulatory Visit (INDEPENDENT_AMBULATORY_CARE_PROVIDER_SITE_OTHER): Admitting: Family Medicine

## 2024-05-28 ENCOUNTER — Encounter: Payer: Self-pay | Admitting: Family Medicine

## 2024-05-28 VITALS — BP 133/87 | HR 71 | Ht 67.0 in | Wt 214.0 lb

## 2024-05-28 DIAGNOSIS — I10 Essential (primary) hypertension: Secondary | ICD-10-CM

## 2024-05-28 DIAGNOSIS — E66811 Obesity, class 1: Secondary | ICD-10-CM | POA: Diagnosis not present

## 2024-05-28 NOTE — Progress Notes (Signed)
 Established Patient Office Visit  Subjective    Patient ID: Meredith Randolph, female    DOB: January 07, 1956  Age: 68 y.o. MRN: [id number]  CC:  Chief Complaint  Patient presents with   Medical Management of Chronic Issues    HPI Meredith Randolph presents for routine follow up of hypertension. Patient reports med compliance and denies acute compliants.   Outpatient Encounter Medications as of 05/28/2024  Medication Sig   amLODipine  (NORVASC ) 10 MG tablet TAKE 1 TABLET BY MOUTH EVERY DAY   losartan -hydrochlorothiazide (HYZAAR) 100-25 MG tablet TAKE 1 TABLET BY MOUTH EVERY DAY   Multiple Vitamins-Minerals (ONE-A-DAY WOMENS PO) Take 3 tablets by mouth daily. Gummy   aspirin  EC 81 MG EC tablet Take 1 tablet (81 mg total) by mouth daily. Swallow whole. (Patient not taking: Reported on 04/30/2024)   metoprolol  succinate (TOPROL -XL) 25 MG 24 hr tablet Take 1 tablet (25 mg total) by mouth daily. (Patient not taking: Reported on 05/28/2024)   rosuvastatin  (CRESTOR ) 20 MG tablet Take 1 tablet (20 mg total) by mouth daily. (Patient not taking: Reported on 05/28/2024)   spironolactone  (ALDACTONE ) 50 MG tablet Take 1 tablet (50 mg total) by mouth daily. (Patient not taking: Reported on 05/28/2024)   No facility-administered encounter medications on file as of 05/28/2024.    Past Medical History:  Diagnosis Date   Hypertension     History reviewed. No pertinent surgical history.  Family History  Problem Relation Age of Onset   Lung cancer Father        smoker    Social History   Socioeconomic History   Marital status: Married    Spouse name: Not on file   Number of children: 1   Years of education: Not on file   Highest education level: Not on file  Occupational History   Occupation: retired  Tobacco Use   Smoking status: Former    Current packs/day: 0.00    Average packs/day: 1 pack/day for 20.0 years (20.0 ttl pk-yrs)    Types: Cigarettes    Start date: 04/16/1979    Quit date:  04/16/1999    Years since quitting: 25.1   Smokeless tobacco: Never  Vaping Use   Vaping status: Never Used  Substance and Sexual Activity   Alcohol use: No   Drug use: No   Sexual activity: Not Currently  Other Topics Concern   Not on file  Social History Narrative   Employment: retired.   Social Drivers of Corporate investment banker Strain: Low Risk  (10/26/2022)   Overall Financial Resource Strain (CARDIA)    Difficulty of Paying Living Expenses: Not hard at all  Food Insecurity: No Food Insecurity (10/26/2022)   Hunger Vital Sign    Worried About Running Out of Food in the Last Year: Never true    Ran Out of Food in the Last Year: Never true  Transportation Needs: No Transportation Needs (10/26/2022)   PRAPARE - Administrator, Civil Service (Medical): No    Lack of Transportation (Non-Medical): No  Physical Activity: Sufficiently Active (10/26/2022)   Exercise Vital Sign    Days of Exercise per Week: 5 days    Minutes of Exercise per Session: 30 min  Stress: No Stress Concern Present (10/26/2022)   [employer] of Occupational Health - Occupational Stress Questionnaire    Feeling of Stress : Not at all  Social Connections: Moderately Integrated (09/08/2023)   Social Connection and Isolation Panel    Frequency  of Communication with Friends and Family: Once a week    Frequency of Social Gatherings with Friends and Family: Once a week    Attends Religious Services: More than 4 times per year    Active Member of [employer] or Organizations: Yes    Attends Engineer, structural: More than 4 times per year    Marital Status: Married  Catering manager Violence: Not At Risk (04/16/2019)   Humiliation, Afraid, Rape, and Kick questionnaire    Fear of Current or Ex-Partner: No    Emotionally Abused: No    Physically Abused: No    Sexually Abused: No    Review of Systems  All other systems reviewed and are negative.       Objective    BP 133/87    Pulse 71   Ht 5' 7 (1.702 m)   Wt 214 lb (97.1 kg)   SpO2 95%   BMI 33.52 kg/m   Physical Exam Vitals and nursing note reviewed.  Constitutional:      General: She is not in acute distress.    Appearance: She is obese.  Cardiovascular:     Rate and Rhythm: Normal rate and regular rhythm.  Pulmonary:     Effort: Pulmonary effort is normal.     Breath sounds: Normal breath sounds.  Abdominal:     Palpations: Abdomen is soft.     Tenderness: There is no abdominal tenderness.  Musculoskeletal:     Right lower leg: No edema.     Left lower leg: No edema.  Neurological:     General: No focal deficit present.     Mental Status: She is alert and oriented to person, place, and time.         Assessment & Plan:   Essential hypertension  Obesity, Class I, BMI 30-34.9     Return in about 4 months (around 09/27/2024).   Tanda Raguel SQUIBB, MD

## 2024-05-30 ENCOUNTER — Encounter: Payer: Self-pay | Admitting: Family Medicine

## 2024-06-27 ENCOUNTER — Other Ambulatory Visit: Payer: Self-pay | Admitting: Family Medicine

## 2024-06-27 NOTE — Telephone Encounter (Signed)
 Requested Prescriptions  Pending Prescriptions Disp Refills   metoprolol  succinate (TOPROL -XL) 25 MG 24 hr tablet [Pharmacy Med Name: METOPROLOL  SUCC ER 25 MG TAB] 90 tablet 1    Sig: TAKE 1 TABLET (25 MG TOTAL) BY MOUTH DAILY.     Cardiovascular:  Beta Blockers Passed - 06/27/2024  4:37 PM      Passed - Last BP in normal range    BP Readings from Last 1 Encounters:  05/28/24 133/87         Passed - Last Heart Rate in normal range    Pulse Readings from Last 1 Encounters:  05/28/24 71         Passed - Valid encounter within last 6 months    Recent Outpatient Visits           1 month ago Essential hypertension   [facility], [place]   1 month ago Uncontrolled hypertension   [facility] [facility], MD   2 months ago Uncontrolled hypertension   [place] [facility], MD   6 months ago Essential hypertension   [facility], [place]   9 months ago Essential hypertension   [facility] [facility], MD       Future Appointments             In 3 months Meredith Bleacher, MD [facility], [address]

## 2024-07-26 ENCOUNTER — Other Ambulatory Visit: Payer: Self-pay | Admitting: Family Medicine

## 2024-08-17 ENCOUNTER — Other Ambulatory Visit: Payer: Self-pay | Admitting: Family Medicine

## 2024-08-19 NOTE — Telephone Encounter (Signed)
 Requested Prescriptions  Pending Prescriptions Disp Refills   amLODipine  (NORVASC ) 10 MG tablet [Pharmacy Med Name: AMLODIPINE  BESYLATE 10 MG TAB] 90 tablet 0    Sig: TAKE 1 TABLET BY MOUTH EVERY DAY     Cardiovascular: Calcium  Channel Blockers 2 Passed - 08/19/2024  2:12 PM      Passed - Last BP in normal range    BP Readings from Last 1 Encounters:  05/28/24 133/87         Passed - Last Heart Rate in normal range    Pulse Readings from Last 1 Encounters:  05/28/24 71         Passed - Valid encounter within last 6 months    Recent Outpatient Visits           2 months ago Essential hypertension   [facility], MD   3 months ago Uncontrolled hypertension   [place] [facility], MD   4 months ago Uncontrolled hypertension   [place] [facility], MD   8 months ago Essential hypertension   [place] [facility], MD   11 months ago Essential hypertension   [place] [facility], MD       Future Appointments             In 1 month Tanda Bleacher, MD [facility] at [facility], [address]

## 2024-09-27 ENCOUNTER — Ambulatory Visit: Admitting: Family Medicine

## 2024-09-27 VITALS — BP 135/75 | HR 73 | Temp 98.0°F | Resp 17 | Ht 67.0 in | Wt 216.6 lb

## 2024-09-27 DIAGNOSIS — E6609 Other obesity due to excess calories: Secondary | ICD-10-CM | POA: Diagnosis not present

## 2024-09-27 DIAGNOSIS — I10 Essential (primary) hypertension: Secondary | ICD-10-CM

## 2024-09-27 DIAGNOSIS — Z6833 Body mass index (BMI) 33.0-33.9, adult: Secondary | ICD-10-CM

## 2024-09-27 DIAGNOSIS — E7800 Pure hypercholesterolemia, unspecified: Secondary | ICD-10-CM

## 2024-09-27 DIAGNOSIS — E66811 Obesity, class 1: Secondary | ICD-10-CM

## 2024-09-27 MED ORDER — AMLODIPINE BESYLATE 10 MG PO TABS: 10.0000 mg | ORAL_TABLET | Freq: Every day | ORAL | 1 refills | Status: AC

## 2024-09-27 MED ORDER — LOSARTAN POTASSIUM-HCTZ 100-25 MG PO TABS: 1.0000 | ORAL_TABLET | Freq: Every day | ORAL | 1 refills | Status: AC

## 2024-09-27 NOTE — Progress Notes (Signed)
 Established Patient Office Visit  Subjective    Patient ID: Meredith Randolph, female    DOB: 1956-01-02  Age: 68 y.o. MRN: [id number]  CC:  Chief Complaint  Patient presents with   Medical Management of Chronic Issues    HPI Meredith Randolph presents for routine follow up of hypertension. Patient reports med compliance and denies acute complaints.   Outpatient Encounter Medications as of 09/27/2024  Medication Sig   Multiple Vitamins-Minerals (ONE-A-DAY WOMENS PO) Take 3 tablets by mouth daily. Gummy   [DISCONTINUED] amLODipine  (NORVASC ) 10 MG tablet TAKE 1 TABLET BY MOUTH EVERY DAY   [DISCONTINUED] losartan -hydrochlorothiazide (HYZAAR) 100-25 MG tablet TAKE 1 TABLET BY MOUTH EVERY DAY   amLODipine  (NORVASC ) 10 MG tablet Take 1 tablet (10 mg total) by mouth daily.   aspirin  EC 81 MG EC tablet Take 1 tablet (81 mg total) by mouth daily. Swallow whole. (Patient not taking: Reported on 04/30/2024)   losartan -hydrochlorothiazide (HYZAAR) 100-25 MG tablet Take 1 tablet by mouth daily.   [DISCONTINUED] metoprolol  succinate (TOPROL -XL) 25 MG 24 hr tablet TAKE 1 TABLET (25 MG TOTAL) BY MOUTH DAILY.   [DISCONTINUED] rosuvastatin  (CRESTOR ) 20 MG tablet Take 1 tablet (20 mg total) by mouth daily. (Patient not taking: Reported on 05/28/2024)   [DISCONTINUED] spironolactone  (ALDACTONE ) 50 MG tablet TAKE 1 TABLET BY MOUTH EVERY DAY   No facility-administered encounter medications on file as of 09/27/2024.    Past Medical History:  Diagnosis Date   Hypertension     No past surgical history on file.  Family History  Problem Relation Age of Onset   Lung cancer Father        smoker    Social History   Socioeconomic History   Marital status: Married    Spouse name: Not on file   Number of children: 1   Years of education: Not on file   Highest education level: Not on file  Occupational History   Occupation: retired  Tobacco Use   Smoking status: Former    Current packs/day: 0.00     Average packs/day: 1 pack/day for 20.0 years (20.0 ttl pk-yrs)    Types: Cigarettes    Start date: 04/16/1979    Quit date: 04/16/1999    Years since quitting: 25.4   Smokeless tobacco: Never  Vaping Use   Vaping status: Never Used  Substance and Sexual Activity   Alcohol use: No   Drug use: No   Sexual activity: Not Currently  Other Topics Concern   Not on file  Social History Narrative   Employment: retired.   Social Drivers of Health   Tobacco Use: Medium Risk (05/30/2024)   Patient History    Smoking Tobacco Use: Former    Smokeless Tobacco Use: Never    Passive Exposure: Not on file  Financial Resource Strain: Low Risk (10/26/2022)   Overall Financial Resource Strain (CARDIA)    Difficulty of Paying Living Expenses: Not hard at all  Food Insecurity: No Food Insecurity (10/26/2022)   Hunger Vital Sign    Worried About Running Out of Food in the Last Year: Never true    Ran Out of Food in the Last Year: Never true  Transportation Needs: No Transportation Needs (10/26/2022)   PRAPARE - Administrator, Civil Service (Medical): No    Lack of Transportation (Non-Medical): No  Physical Activity: Sufficiently Active (10/26/2022)   Exercise Vital Sign    Days of Exercise per Week: 5 days    Minutes  of Exercise per Session: 30 min  Stress: No Stress Concern Present (10/26/2022)   [employer] of Occupational Health - Occupational Stress Questionnaire    Feeling of Stress : Not at all  Social Connections: Moderately Integrated (09/08/2023)   Social Connection and Isolation Panel    Frequency of Communication with Friends and Family: Once a week    Frequency of Social Gatherings with Friends and Family: Once a week    Attends Religious Services: More than 4 times per year    Active Member of Clubs or Organizations: Yes    Attends Banker Meetings: More than 4 times per year    Marital Status: Married  Catering Manager Violence: Not on file   Depression (PHQ2-9): Low Risk (04/01/2024)   Depression (PHQ2-9)    PHQ-2 Score: 0  Alcohol Screen: Low Risk (09/08/2023)   Alcohol Screen    Last Alcohol Screening Score (AUDIT): 0  Housing: Low Risk (09/08/2023)   Housing    Last Housing Risk Score: 0  Utilities: Not At Risk (09/08/2023)   AHC Utilities    Threatened with loss of utilities: No  Health Literacy: Adequate Health Literacy (09/08/2023)   B1300 Health Literacy    Frequency of need for help with medical instructions: Never    Review of Systems  All other systems reviewed and are negative.       Objective    BP 135/75   Pulse 73   Temp 98 F (36.7 C) (Oral)   Resp 17   Ht 5' 7 (1.702 m)   Wt 216 lb 9.6 oz (98.2 kg)   SpO2 95%   BMI 33.92 kg/m   Physical Exam Vitals and nursing note reviewed.  Constitutional:      General: She is not in acute distress.    Appearance: She is obese.  Cardiovascular:     Rate and Rhythm: Normal rate and regular rhythm.  Pulmonary:     Effort: Pulmonary effort is normal.     Breath sounds: Normal breath sounds.  Abdominal:     Palpations: Abdomen is soft.     Tenderness: There is no abdominal tenderness.  Neurological:     General: No focal deficit present.     Mental Status: She is alert and oriented to person, place, and time.         Assessment & Plan:   Essential hypertension -     Losartan  Potassium-HCTZ; Take 1 tablet by mouth daily.  Dispense: 90 tablet; Refill: 1  Pure hypercholesterolemia  Class 1 obesity due to excess calories with serious comorbidity and body mass index (BMI) of 33.0 to 33.9 in adult  Other orders -     amLODIPine  Besylate; Take 1 tablet (10 mg total) by mouth daily.  Dispense: 90 tablet; Refill: 1     No follow-ups on file.   Tanda Raguel SQUIBB, MD

## 2024-09-30 ENCOUNTER — Encounter: Payer: Self-pay | Admitting: Family Medicine

## 2024-11-07 NOTE — Progress Notes (Signed)
 JEIRY BIRNBAUM                                          MRN: [id number]   11/07/2024   The [facility] Quality Team Specialist reviewed this patient medical record for the purposes of chart review for care gap closure. The following were reviewed: chart review for care gap closure-breast cancer screening.    [facility] Quality Team
